# Patient Record
Sex: Female | Born: 1970 | Race: White | Hispanic: No | Marital: Single | Smoking: Never smoker
Health system: Southern US, Community
[De-identification: ages and names within clinical notes are randomized; demographics above are authoritative.]

## PROBLEM LIST (undated history)

## (undated) DIAGNOSIS — R293 Abnormal posture: Secondary | ICD-10-CM

## (undated) DIAGNOSIS — K219 Gastro-esophageal reflux disease without esophagitis: Secondary | ICD-10-CM

## (undated) DIAGNOSIS — E611 Iron deficiency: Secondary | ICD-10-CM

## (undated) DIAGNOSIS — F8189 Other developmental disorders of scholastic skills: Secondary | ICD-10-CM

## (undated) DIAGNOSIS — K2970 Gastritis, unspecified, without bleeding: Secondary | ICD-10-CM

## (undated) DIAGNOSIS — R32 Unspecified urinary incontinence: Secondary | ICD-10-CM

## (undated) DIAGNOSIS — D649 Anemia, unspecified: Secondary | ICD-10-CM

## (undated) DIAGNOSIS — F79 Unspecified intellectual disabilities: Secondary | ICD-10-CM

## (undated) HISTORY — PX: NO PAST SURGERIES: SHX2092

---

## 2013-10-28 ENCOUNTER — Ambulatory Visit
Admission: RE | Admit: 2013-10-28 | Discharge: 2013-10-28 | Disposition: A | Discharge status: Home / Self Care | Payer: Medicaid Other | Source: Ambulatory Visit | Attending: Family Medicine | Admitting: Family Medicine

## 2013-10-28 ENCOUNTER — Other Ambulatory Visit: Payer: Self-pay | Admitting: Family Medicine

## 2013-10-28 DIAGNOSIS — M955 Acquired deformity of pelvis: Secondary | ICD-10-CM

## 2014-05-05 ENCOUNTER — Encounter (HOSPITAL_COMMUNITY): Payer: Self-pay | Admitting: *Deleted

## 2014-05-05 ENCOUNTER — Other Ambulatory Visit: Payer: Self-pay | Admitting: Dentistry

## 2014-05-05 NOTE — Progress Notes (Signed)
Anesthesia Chart Review:  SAME DAY WORK-UP.  Patient is a [age] year female with mental retardation history who currently resides at a [facility] group home [address]. She is scheduled to dental surgery with Dr. Martie RoundMilner tomorrow. Reportedly she lived with her mother until she died within the past year.  Group home staff reported other history of GERD and iron deficiency anemia.  She also seems to have mouth pain.  She takes medications with pudding, but can take liquids okay.  Staff report that she will need to be sedated prior to labs/IV, etc as she can be combative. She has a brother in [place] and a sister in [place].  Dr. Katina DungHoover Royals completed her H&P. Reportedly, he may want additional labs drawn while she is under anesthesia.  Group home staff to bring in the appropriate paperwork to clarify.    Meds include Valium, Depakote (as a mood stabilizer), Ativan, Protonix, Seroquel.  Further evaluation on the day of surgery.  Hannah Ochsllison Aarionna Germer, PA-C [facility]Anesthesiology Phone [phone] 05/05/2014 12:52 PM

## 2014-05-05 NOTE — Progress Notes (Signed)
Pt is a resident of [facility] [address] [facility]. She is severely mentally retarded, is non-verbal and does not usually follow commands per Judeen Hammanshonda English, RN for group home. She takes her po meds with applesauce or pudding, so no meds are to be taken the morning of surgery. Bjorn LoserRhonda states pt will not allow anyone to do anything (labs, IV, etc) to her without being sedated. Pt can and will drink anything she is given, Bjorn LoserRhonda suggests that she be given a "cocktail" to sedate her prior to anything being done. States pt will fight. Per Bjorn Loserhonda, prior to pt coming to group home in July of 2015 pt had not had any medical or dental care. She had lived with her mother who died at the home and the patient had to be forcefully taken out of the home and put into group home care. She does have a brother and sister who are her legal guardians and they should be available by phone. Their info is in the pt's chart. A caregiver will be with pt and Bjorn LoserRhonda will be here at some point in time.

## 2014-05-06 ENCOUNTER — Ambulatory Visit (HOSPITAL_COMMUNITY): Payer: Medicaid Other | Admitting: Vascular Surgery

## 2014-05-06 ENCOUNTER — Encounter (HOSPITAL_COMMUNITY)
Admission: RE | Disposition: A | Discharge status: Home / Self Care | Payer: Self-pay | Source: Ambulatory Visit | Attending: Dentistry

## 2014-05-06 ENCOUNTER — Ambulatory Visit (HOSPITAL_COMMUNITY)
Admission: RE | Admit: 2014-05-06 | Discharge: 2014-05-06 | Disposition: A | Discharge status: Home / Self Care | Payer: Medicaid Other | Source: Ambulatory Visit | Attending: Dentistry | Admitting: Dentistry

## 2014-05-06 ENCOUNTER — Encounter (HOSPITAL_COMMUNITY): Payer: Self-pay | Admitting: *Deleted

## 2014-05-06 DIAGNOSIS — F73 Profound intellectual disabilities: Secondary | ICD-10-CM | POA: Diagnosis not present

## 2014-05-06 DIAGNOSIS — K219 Gastro-esophageal reflux disease without esophagitis: Secondary | ICD-10-CM | POA: Insufficient documentation

## 2014-05-06 DIAGNOSIS — K029 Dental caries, unspecified: Secondary | ICD-10-CM | POA: Diagnosis not present

## 2014-05-06 DIAGNOSIS — F79 Unspecified intellectual disabilities: Secondary | ICD-10-CM | POA: Insufficient documentation

## 2014-05-06 HISTORY — PX: DENTAL RESTORATION/EXTRACTION WITH X-RAY: SHX5796

## 2014-05-06 HISTORY — DX: Unspecified intellectual disabilities: F79

## 2014-05-06 HISTORY — DX: Gastritis, unspecified, without bleeding: K29.70

## 2014-05-06 HISTORY — DX: Iron deficiency: E61.1

## 2014-05-06 HISTORY — DX: Anemia, unspecified: D64.9

## 2014-05-06 HISTORY — DX: Gastro-esophageal reflux disease without esophagitis: K21.9

## 2014-05-06 LAB — COMPREHENSIVE METABOLIC PANEL
ALK PHOS: 53 U/L (ref 39–117)
ALT: 12 U/L (ref 0–35)
AST: 18 U/L (ref 0–37)
Albumin: 3.7 g/dL (ref 3.5–5.2)
Anion gap: 8 (ref 5–15)
BILIRUBIN TOTAL: 0.8 mg/dL (ref 0.3–1.2)
BUN: 12 mg/dL (ref 6–23)
CHLORIDE: 104 mmol/L (ref 96–112)
CO2: 24 mmol/L (ref 19–32)
CREATININE: 0.5 mg/dL (ref 0.50–1.10)
Calcium: 8.7 mg/dL (ref 8.4–10.5)
Glucose, Bld: 79 mg/dL (ref 70–99)
Potassium: 3.9 mmol/L (ref 3.5–5.1)
Sodium: 136 mmol/L (ref 135–145)
Total Protein: 6.4 g/dL (ref 6.0–8.3)

## 2014-05-06 LAB — CBC
HCT: 34 % — ABNORMAL LOW (ref 36.0–46.0)
Hemoglobin: 11.8 g/dL — ABNORMAL LOW (ref 12.0–15.0)
MCH: 31 pg (ref 26.0–34.0)
MCHC: 34.7 g/dL (ref 30.0–36.0)
MCV: 89.2 fL (ref 78.0–100.0)
PLATELETS: 167 10*3/uL (ref 150–400)
RBC: 3.81 MIL/uL — AB (ref 3.87–5.11)
RDW: 12.4 % (ref 11.5–15.5)
WBC: 4.4 10*3/uL (ref 4.0–10.5)

## 2014-05-06 SURGERY — DENTAL RESTORATION/EXTRACTION WITH X-RAY
Anesthesia: General | Site: Mouth

## 2014-05-06 MED ORDER — PHENYLEPHRINE HCL 10 MG/ML IJ SOLN
INTRAMUSCULAR | Status: AC
  Filled 2014-05-06: qty 1

## 2014-05-06 MED ORDER — KETOROLAC TROMETHAMINE 30 MG/ML IJ SOLN
INTRAMUSCULAR | Status: DC | PRN
  Administered 2014-05-06: 30 mg via INTRAVENOUS

## 2014-05-06 MED ORDER — CHLORHEXIDINE GLUCONATE 0.12 % MT SOLN
OROMUCOSAL | Status: DC | PRN
  Administered 2014-05-06: 15 mL via OROMUCOSAL

## 2014-05-06 MED ORDER — LIDOCAINE HCL (CARDIAC) 20 MG/ML IV SOLN
INTRAVENOUS | Status: DC | PRN
  Administered 2014-05-06: 50 mg via INTRAVENOUS

## 2014-05-06 MED ORDER — SODIUM CHLORIDE 0.9 % IJ SOLN
INTRAMUSCULAR | Status: AC
  Filled 2014-05-06: qty 10

## 2014-05-06 MED ORDER — ROCURONIUM BROMIDE 50 MG/5ML IV SOLN
INTRAVENOUS | Status: AC
  Filled 2014-05-06: qty 1

## 2014-05-06 MED ORDER — ONDANSETRON HCL 4 MG/2ML IJ SOLN
INTRAMUSCULAR | Status: AC
  Filled 2014-05-06: qty 2

## 2014-05-06 MED ORDER — LIDOCAINE-EPINEPHRINE 2 %-1:100000 IJ SOLN
INTRAMUSCULAR | Status: DC | PRN
  Administered 2014-05-06: 20 mL

## 2014-05-06 MED ORDER — GLYCOPYRROLATE 0.2 MG/ML IJ SOLN
INTRAMUSCULAR | Status: DC | PRN
  Administered 2014-05-06: 0.4 mg via INTRAVENOUS
  Administered 2014-05-06: 0.6 mg via INTRAVENOUS

## 2014-05-06 MED ORDER — NEOSTIGMINE METHYLSULFATE 10 MG/10ML IV SOLN
INTRAVENOUS | Status: DC | PRN
  Administered 2014-05-06: 3 mg via INTRAVENOUS

## 2014-05-06 MED ORDER — ONDANSETRON HCL 4 MG/2ML IJ SOLN: 4.0000 mg | Freq: Once | INTRAMUSCULAR | Status: DC | PRN

## 2014-05-06 MED ORDER — ARTIFICIAL TEARS OP OINT
TOPICAL_OINTMENT | OPHTHALMIC | Status: DC | PRN
Start: 2014-05-06 — End: 2014-05-06
  Administered 2014-05-06: 1 via OPHTHALMIC

## 2014-05-06 MED ORDER — NEOSTIGMINE METHYLSULFATE 10 MG/10ML IV SOLN
INTRAVENOUS | Status: AC
  Filled 2014-05-06: qty 1

## 2014-05-06 MED ORDER — MIDAZOLAM HCL 2 MG/2ML IJ SOLN
INTRAMUSCULAR | Status: AC
  Filled 2014-05-06: qty 2

## 2014-05-06 MED ORDER — OXYCODONE HCL 5 MG/5ML PO SOLN: 5.0000 mg | Freq: Once | ORAL | Status: DC | PRN

## 2014-05-06 MED ORDER — PROPOFOL 10 MG/ML IV BOLUS
INTRAVENOUS | Status: DC | PRN
  Administered 2014-05-06: 125 mg via INTRAVENOUS

## 2014-05-06 MED ORDER — FENTANYL CITRATE 0.05 MG/ML IJ SOLN
INTRAMUSCULAR | Status: DC | PRN
  Administered 2014-05-06: 75 ug via INTRAVENOUS

## 2014-05-06 MED ORDER — KETOROLAC TROMETHAMINE 30 MG/ML IJ SOLN
INTRAMUSCULAR | Status: AC
  Filled 2014-05-06: qty 1

## 2014-05-06 MED ORDER — GLYCOPYRROLATE 0.2 MG/ML IJ SOLN
INTRAMUSCULAR | Status: AC
  Filled 2014-05-06: qty 3

## 2014-05-06 MED ORDER — SODIUM CHLORIDE 0.9 % IV SOLN
10.0000 mg | INTRAVENOUS | Status: DC | PRN
  Administered 2014-05-06: 25 ug/min via INTRAVENOUS

## 2014-05-06 MED ORDER — FENTANYL CITRATE 0.05 MG/ML IJ SOLN: 25.0000 ug | INTRAMUSCULAR | Status: DC | PRN

## 2014-05-06 MED ORDER — FENTANYL CITRATE 0.05 MG/ML IJ SOLN
INTRAMUSCULAR | Status: AC
  Filled 2014-05-06: qty 5

## 2014-05-06 MED ORDER — LACTATED RINGERS IV SOLN
INTRAVENOUS | Status: DC | PRN
  Administered 2014-05-06 (×2): via INTRAVENOUS

## 2014-05-06 MED ORDER — PHENYLEPHRINE HCL 10 MG/ML IJ SOLN
INTRAMUSCULAR | Status: DC | PRN
  Administered 2014-05-06 (×3): 80 ug via INTRAVENOUS

## 2014-05-06 MED ORDER — LIDOCAINE HCL (CARDIAC) 20 MG/ML IV SOLN
INTRAVENOUS | Status: AC
  Filled 2014-05-06: qty 5

## 2014-05-06 MED ORDER — ROCURONIUM BROMIDE 100 MG/10ML IV SOLN
INTRAVENOUS | Status: DC | PRN
  Administered 2014-05-06: 30 mg via INTRAVENOUS

## 2014-05-06 MED ORDER — OXYCODONE HCL 5 MG PO TABS: 5.0000 mg | ORAL_TABLET | Freq: Once | ORAL | Status: DC | PRN

## 2014-05-06 MED ORDER — DEXAMETHASONE SODIUM PHOSPHATE 10 MG/ML IJ SOLN
INTRAMUSCULAR | Status: DC | PRN
  Administered 2014-05-06: 8 mg via INTRAVENOUS

## 2014-05-06 MED ORDER — EPHEDRINE SULFATE 50 MG/ML IJ SOLN
INTRAMUSCULAR | Status: AC
  Filled 2014-05-06: qty 1

## 2014-05-06 MED ORDER — DEXAMETHASONE SODIUM PHOSPHATE 4 MG/ML IJ SOLN
INTRAMUSCULAR | Status: AC
  Filled 2014-05-06: qty 2

## 2014-05-06 MED ORDER — EPHEDRINE SULFATE 50 MG/ML IJ SOLN
INTRAMUSCULAR | Status: DC | PRN
  Administered 2014-05-06 (×3): 10 mg via INTRAVENOUS

## 2014-05-06 MED ORDER — KETAMINE HCL 50 MG/ML IJ SOLN
INTRAMUSCULAR | Status: DC | PRN
  Administered 2014-05-06: 250 mg via INTRAMUSCULAR

## 2014-05-06 SURGICAL SUPPLY — 20 items
CANISTER SUCTION 2500CC (MISCELLANEOUS) ×3 IMPLANT
COVER PROBE W GEL 5X96 (DRAPES) ×3 IMPLANT
COVER SURGICAL LIGHT HANDLE (MISCELLANEOUS) ×3 IMPLANT
COVER TABLE BACK 60X90 (DRAPES) ×3 IMPLANT
DRAPE PROXIMA HALF (DRAPES) ×3 IMPLANT
GAUZE SPONGE 4X4 16PLY XRAY LF (GAUZE/BANDAGES/DRESSINGS) ×3 IMPLANT
GLOVE BIO SURGEON STRL SZ7.5 (GLOVE) ×3 IMPLANT
GOWN STRL REUS W/ TWL LRG LVL3 (GOWN DISPOSABLE) ×2 IMPLANT
GOWN STRL REUS W/TWL LRG LVL3 (GOWN DISPOSABLE) ×4
KIT BASIN OR (CUSTOM PROCEDURE TRAY) ×3 IMPLANT
KIT ROOM TURNOVER OR (KITS) ×3 IMPLANT
PAD ARMBOARD 7.5X6 YLW CONV (MISCELLANEOUS) ×6 IMPLANT
SPONGE SURGIFOAM ABS GEL SZ50 (HEMOSTASIS) ×3 IMPLANT
SUT CHROMIC 3 0 PS 2 (SUTURE) ×6 IMPLANT
SYR CONTROL 10ML LL (SYRINGE) ×3 IMPLANT
TOWEL OR 17X26 10 PK STRL BLUE (TOWEL DISPOSABLE) ×3 IMPLANT
TUBE CONNECTING 12'X1/4 (SUCTIONS) ×1
TUBE CONNECTING 12X1/4 (SUCTIONS) ×2 IMPLANT
WATER STERILE IRR 1000ML POUR (IV SOLUTION) ×6 IMPLANT
YANKAUER SUCT BULB TIP NO VENT (SUCTIONS) ×3 IMPLANT

## 2014-05-06 NOTE — Progress Notes (Signed)
Spoke with Dr. Noreene LarssonJoslin, informed him that there additional labs the resident home is requesting. He will see the pt. shortly.

## 2014-05-06 NOTE — Brief Op Note (Signed)
05/06/2014  12:32 PM  PATIENT:  Hannah Houston  44 y.o. female  PRE-OPERATIVE DIAGNOSIS:  DENTAL CARIES   POST-OPERATIVE DIAGNOSIS:  DENTAL CARIES   PROCEDURE:  Procedure(s) with comments: CLEANING XRAY, DENTAL RESTORATION AND EXTRACTIONS (N/A) - Recall Exam, Full mouth xrays, full mouth debridement,  Glass ionomers - #2 OF; #3 OL; #4 O; #6 F; #12 O; #13 O; #14 OFL; #20 DO Extractions - 12 Prime - #14, 15, 18, 19, 23, 27, 28, 29, 30, 31  SURGEON:  Surgeon(s) and Role:    * Esaw DaceWilliam E Gavon Majano Jr., DDS - Primary  PHYSICIAN ASSISTANT:   ASSISTANTS: Laqueta CarinaMary Elizabeth Andreyev   ANESTHESIA:   general  EBL:  Total I/O In: 1650 [I.V.:1650] Out: 100 [Blood:100]  BLOOD ADMINISTERED:none  DRAINS: none   LOCAL MEDICATIONS USED:  LIDOCAINE   SPECIMEN:  No Specimen  DISPOSITION OF SPECIMEN:  N/A  COUNTS:  YES  TOURNIQUET:  * No tourniquets in log *  DICTATION: .Note written in EPIC  PLAN OF CARE: Discharge to home after PACU  PATIENT DISPOSITION:  PACU - hemodynamically stable.   Delay start of Pharmacological VTE agent (>24hrs) due to surgical blood loss or risk of bleeding: not applicable

## 2014-05-06 NOTE — Op Note (Signed)
See Brief Op Notes for procedures.

## 2014-05-06 NOTE — Progress Notes (Signed)
[facility]. Orders for labs in resident's chart. Hannah LoserRhonda is asking that these labs be captured after pt. Is sedated.

## 2014-05-06 NOTE — Anesthesia Procedure Notes (Signed)
Procedure Name: Intubation Date/Time: 05/06/2014 9:47 AM Performed by: Carmela RimaMARTINELLI, Kymorah Korf F Pre-anesthesia Checklist: Patient being monitored, Suction available, Emergency Drugs available, Patient identified and Timeout performed Patient Re-evaluated:Patient Re-evaluated prior to inductionOxygen Delivery Method: Circle system utilized Preoxygenation: Pre-oxygenation with 100% oxygen Intubation Type: IV induction Ventilation: Mask ventilation without difficulty Laryngoscope Size: Mac and 3 Grade View: Grade I Nasal Tubes: Nasal prep performed, Right, Nasal Rae and Magill forceps- large, utilized Tube size: 6.0 mm Number of attempts: 2 (Per Dr. Noreene LarssonJoslin) Placement Confirmation: positive ETCO2,  ETT inserted through vocal cords under direct vision and breath sounds checked- equal and bilateral Tube secured with: Tape Dental Injury: Teeth and Oropharynx as per pre-operative assessment

## 2014-05-06 NOTE — H&P (Signed)
H&P reviewed, no changes in assessment  

## 2014-05-06 NOTE — Discharge Instructions (Signed)
What to eat: ° °For your first meals, you should eat lightly; only small meals initially.  If you do not have nausea, you may eat larger meals.  Avoid spicy, greasy and heavy food.   ° °General Anesthesia, Adult, Care After  °Refer to this sheet in the next few weeks. These instructions provide you with information on caring for yourself after your procedure. Your health care provider may also give you more specific instructions. Your treatment has been planned according to current medical practices, but problems sometimes occur. Call your health care provider if you have any problems or questions after your procedure.  °WHAT TO EXPECT AFTER THE PROCEDURE  °After the procedure, it is typical to experience:  °Sleepiness.  °Nausea and vomiting. °HOME CARE INSTRUCTIONS  °For the first 24 hours after general anesthesia:  °Have a responsible person with you.  °Do not drive a car. If you are alone, do not take public transportation.  °Do not drink alcohol.  °Do not take medicine that has not been prescribed by your health care provider.  °Do not sign important papers or make important decisions.  °You may resume a normal diet and activities as directed by your health care provider.  °Change bandages (dressings) as directed.  °If you have questions or problems that seem related to general anesthesia, call the hospital and ask for the anesthetist or anesthesiologist on call. °SEEK MEDICAL CARE IF:  °You have nausea and vomiting that continue the day after anesthesia.  °You develop a rash. °SEEK IMMEDIATE MEDICAL CARE IF:  °You have difficulty breathing.  °You have chest pain.  °You have any allergic problems. °Document Released: 06/10/2000 Document Revised: 11/04/2012 Document Reviewed: 09/17/2012  °ExitCare® Patient Information ©2014 [employer].  ° °Sore Throat  ° ° °A sore throat is a painful, burning, sore, or scratchy feeling of the throat. There may be pain or tenderness when swallowing or talking. You may have  other symptoms with a sore throat. These include coughing, sneezing, fever, or a swollen neck. A sore throat is often the first sign of another sickness. These sicknesses may include a cold, flu, strep throat, or an infection called mono. Most sore throats go away without medical treatment.  °HOME CARE  °Only take medicine as told by your doctor.  °Drink enough fluids to keep your pee (urine) clear or pale yellow.  °Rest as needed.  °Try using throat sprays, lozenges, or suck on hard candy (if older than 4 years or as told).  °Sip warm liquids, such as broth, herbal tea, or warm water with honey. Try sucking on frozen ice pops or drinking cold liquids.  °Rinse the mouth (gargle) with salt water. Mix 1 teaspoon salt with 8 ounces of water.  °Do not smoke. Avoid being around others when they are smoking.  °Put a humidifier in your bedroom at night to moisten the air. You can also turn on a hot shower and sit in the bathroom for 5-10 minutes. Be sure the bathroom door is closed. °GET HELP RIGHT AWAY IF:  °You have trouble breathing.  °You cannot swallow fluids, soft foods, or your spit (saliva).  °You have more puffiness (swelling) in the throat.  °Your sore throat does not get better in 7 days.  °You feel sick to your stomach (nauseous) and throw up (vomit).  °You have a fever or lasting symptoms for more than 2-3 days.  °You have a fever and your symptoms suddenly get worse. °MAKE SURE YOU:  °Understand these   instructions.  Will watch your condition.  Will get help right away if you are not doing well or get worse. Document Released: 12/12/2007 Document Revised: 11/27/2011 Document Reviewed: 11/10/2011  [facility] Patient Information 2015 [employer] [place]. This information is not intended to replace advice given to you by your health care provider. Make sure you discuss any questions you have with your health care provider.    Sinus precautions after surgery:  1. Avoid blowing your nose.  2. Avoid sneezing. If  you might sneeze, Keep her mouth open and do not pinch your nose closed.  3. Avoid sucking on straws. Avoid smoking.  4. Do not lift objects weighing more than 20 pounds  Call if questions arise.   MOUTH CARE AFTER SURGERY  FACTS:  Ice used in ice bag helps keep the swelling down, and can help lessen the pain.  It is easier to treat pain BEFORE it happens.  Spitting disturbs the clot and may cause bleeding to start again, or to get worse.  Smoking delays healing and can cause complications.  Sharing prescriptions can be dangerous. Do not take medications not recently prescribed for you.  Antibiotics may stop birth control pills from working. Use other means of birth control while on antibiotics.  Warm salt water rinses after the first 24 hours will help lessen the swelling: Use 1/2 teaspoonful of table salt per oz.of water. DO NOT:  Do not spit. Do not drink through a straw.  Strongly advised not to smoke, dip snuff or chew tobacco at least for 3 days.  Do not eat sharp or crunchy foods. Avoid the area of surgery when chewing.  Do not stop your antibiotics before your instructions say to do so.  Do not eat hot foods until bleeding has stopped. If you need to, let your food cool down to room temperature. EXPECT:  Some swelling, especially first 2-3 days.  Soreness or discomfort in varying degrees. Follow your dentist's instructions about how to handle pain before it starts.  Pinkish saliva or light blood in saliva, or on your pillow in the morning. This can last around 24 hours.  Bruising inside or outside the mouth. This may not show up until 2-3 days after surgery. Don't worry, it will go away in time.  Pieces of "bone" may work themselves loose. It's OK. If they bother you, let us know. WHAT TO DO IMMEDIATELY AFTER SURGERY:  Bite on the gauze with steady pressure for 1-2 hours. Don't chew on the gauze.  Do not lie down flat. Raise your head support especially for the first 24 hours.    Apply ice to your face on the side of the surgery. You may apply it 20 minutes on and a few minutes off. Ice for 8-12 hours. You may use ice up to 24 hours.  Before the numbness wears off, take a pain pill as instructed.  Prescription pain medication is not always required. SWELLING:  Expect swelling for the first couple of days. It should get better after that.  If swelling increases 3 days or so after surgery; let us know as soon as possible. FEVER:  Take Tylenol every 4 hours if needed to lower your temperature, especially if it is at 100F or higher.  Drink lots of fluids.  If the fever does not go away, let us know. BREATHING TROUBLE:  Any unusual difficulty breathing means you have to have someone bring you to the emergency room ASAP BLEEDING:  Light oozing is expected for  24 hours or so.  Prop head up with pillows  Avoid spitting  Do not confuse bright red fresh flowing blood with lots of saliva colored with a little bit of blood.  If you notice some bleeding, place gauze or a tea bag where it is bleeding and apply CONSTANT pressure by biting down for 1 hour. Avoid talking during this time. Do not remove the gauze or tea bag during this hour to "check" the bleeding.  If you notice bright RED bleeding FLOWING out of particular area, and filling the floor of your mouth, put a wad of gauze on that area, bite down firmly and constantly. Call us immediately. If we're closed, have someone bring you to the emergency room. ORAL HYGIENE:  Brush your teeth as usual after meals and before bedtime.  Use a soft toothbrush around the area of surgery.  DO NOT AVOID BRUSHING. Otherwise bacteria(germs) will grow and may delay healing or encourage infection.  Since you cannot spit, just gently rinse and let the water flow out of your mouth.  DO NOT SWISH HARD. EATING:  Cool liquids are a good point to start. Increase to soft foods as tolerated. PRESCRIPTIONS:  Follow the directions for your  prescriptions exactly as written.  If your doctor gave you a narcotic pain medication, do not drive, operate machinery or drink alcohol when on that medication. QUESTIONS:  Call our office during office hours

## 2014-05-06 NOTE — Progress Notes (Signed)
Spoke with Dr. Martie RoundMilner, he is OK with the way the consent is completed & signed by guardian/sibling

## 2014-05-06 NOTE — Transfer of Care (Signed)
Immediate Anesthesia Transfer of Care Note  Patient: Hannah Houston  Procedure(s) Performed: Procedure(s) with comments: CLEANING XRAY, DENTAL RESTORATION AND EXTRACTIONS (N/A) - Recall Exam, Full mouth xrays and full mouth debridement,  Glass ionomers - #2 OF; #3 OL; #4 O; #6 F; #12 O; #13 O; #14 OFL; #20 DO Extractions - 12 Prime - #14, 15, 18, 19, 23, 27, 28, 29, 30, 31  Patient Location: PACU  Anesthesia Type:General  Level of Consciousness: awake  Airway & Oxygen Therapy: Patient Spontanous Breathing and Patient connected to nasal cannula oxygen  Post-op Assessment: Report given to RN, Post -op Vital signs reviewed and stable and Patient moving all extremities X 4  Post vital signs: Reviewed and stable  Last Vitals: There were no vitals filed for this visit.  Complications: No apparent anesthesia complications

## 2014-05-06 NOTE — Brief Op Note (Signed)
05/06/2014  12:40 PM  PATIENT:  Hannah Houston  44 y.o. female  PRE-OPERATIVE DIAGNOSIS:  DENTAL CARIES   POST-OPERATIVE DIAGNOSIS:  DENTAL CARIES   PROCEDURE:  Procedure(s) with comments: CLEANING XRAY, DENTAL RESTORATION AND EXTRACTIONS (N/A) - Recall Exam, Full mouth xrays, full mouth debridement,  Glass ionomers - #2 OF; #3 OL; #4 O; #6 F; #12 O; #13 O; #14 OFL; #20 DO Extractions - 12 Prime - #14, 15, 18, 19, 23, 27, 28, 29, 30, 31  SURGEON:  Surgeon(s) and Role:    * Esaw DaceWilliam E Cera Rorke Jr., DDS - Primary  PHYSICIAN ASSISTANT:   ASSISTANTS:  Laqueta CarinaMary Elizabeth Andreyev  ANESTHESIA:   general  EBL:  Total I/O In: 1650 [I.V.:1650] Out: 100 [Blood:100]  BLOOD ADMINISTERED:none  DRAINS: none   LOCAL MEDICATIONS USED:  LIDOCAINE   SPECIMEN:  No Specimen  DISPOSITION OF SPECIMEN:  N/A  COUNTS:  YES  TOURNIQUET:  * No tourniquets in log *  DICTATION: .Note written in EPIC  PLAN OF CARE: Discharge to home after PACU  PATIENT DISPOSITION:  PACU - hemodynamically stable.   Delay start of Pharmacological VTE agent (>24hrs) due to surgical blood loss or risk of bleeding: not applicable

## 2014-05-06 NOTE — Anesthesia Postprocedure Evaluation (Signed)
  Anesthesia Post-op Note  Patient: Hannah Houston, Hannah Houston  Procedure(s) Performed: Procedure(s) with comments: CLEANING XRAY, DENTAL RESTORATION AND EXTRACTIONS (N/A) - Recall Exam, Full mouth xrays and full mouth debridement,  Glass ionomers - #2 OF; #3 OL; #4 O; #6 F; #12 O; #13 O; #14 OFL; #20 DO Extractions - 12 Prime - #14, 15, 18, 19, 23, 27, 28, 29, 30, 31  Patient Location: PACU  Anesthesia Type:General  Level of Consciousness: awake, alert  and pateint uncooperative  Airway and Oxygen Therapy: Patient Spontanous Breathing  Post-op Pain: mild  Post-op Assessment: Post-op Vital signs reviewed, Patient's Cardiovascular Status Stable, Respiratory Function Stable, Patent Airway and Pain level controlled  Post-op Vital Signs: stable  Last Vitals:  Filed Vitals:   05/06/14 1330  BP:   Pulse: 84  Temp:   Resp: 14    Complications: No apparent anesthesia complications

## 2014-05-06 NOTE — Anesthesia Preprocedure Evaluation (Addendum)
Anesthesia Evaluation  Patient identified by MRN, date of birth, ID band Patient unresponsive    Airway       Comment: Unable to assess Dental   Pulmonary          Cardiovascular     Neuro/Psych PSYCHIATRIC DISORDERS    GI/Hepatic GERD-  Medicated,  Endo/Other    Renal/GU      Musculoskeletal   Abdominal   Peds  Hematology  (+) anemia ,   Anesthesia Other Findings Profound mental retardation  Reproductive/Obstetrics                             Anesthesia Physical Anesthesia Plan  ASA: III  Anesthesia Plan: General ETT   Post-op Pain Management:    Induction:   Airway Management Planned:   Additional Equipment:   Intra-op Plan:   Post-operative Plan:   Informed Consent:   Dental Advisory Given  Plan Discussed with: Anesthesiologist, CRNA and Surgeon  Anesthesia Plan Comments:         Anesthesia Quick Evaluation

## 2014-05-07 ENCOUNTER — Encounter (HOSPITAL_COMMUNITY): Payer: Self-pay | Admitting: Dentistry

## 2014-05-07 NOTE — Op Note (Signed)
NAMEarney Mallet:  Cooler, Emmalee             ACCOUNT NO.:  [id number]  MEDICAL RECORD NO.:  [id number]  LOCATION:  [facility]                         FACILITY:  [facility]  PHYSICIAN:  Esaw DaceWilliam E. Royalty Fakhouri Jr., D.D.S.DATE OF BIRTH:  [birth year]  DATE OF PROCEDURE:  05/06/2014 DATE OF DISCHARGE:  05/06/2014                              OPERATIVE REPORT   PREOPERATIVE DIAGNOSIS:  Dental caries/behavior management issues due to intellectual/developmental disabilities.  Dental care provided in the OR for medically necessary treatment.  OPERATION:  Full-mouth oral rehabilitation including exam, x-rays, cleaning, extractions.  OPERATIVE SURGEON:  Azucena FreedWilliam E. Venancio Chenier, D.D.S.  ASSISTANT:  Laqueta CarinaMary Elizabeth Andreyev and hospital staff.  ANESTHESIA:  General.  PROCEDURE IN DETAIL:  The patient was brought into the operating room and placed in the supine position.  General anesthesia was administered via nasal intubation.  The patient was prepped and draped in the usual manner for an intraoral general dentistry procedure.  Oropharynx was suctioned and a moistened posterior throat pack was placed.  A full intraoral exam including all hard and soft tissues was performed.  This was on initial exam.  Soft tissue exam reveals floor of the mouth, buccal mucosa, soft palate, hard palate, tongue, gingival and frenum attachments all within normal limits with regard to size, color, and consistency.  Hard tissue exam reveals; Tooth #1 unerupted, #2 through 16 present, 17 missing, 18 through 23 present, 24, 25 and 26 missing, 27 through 32 present.  Full mouth series of digital x-rays was taken out.  Full mouth debridement was completed. Operative care was provided with a high-speed handpiece and #557 bur and a #4 bur and a slow speed handpiece #4 bur.  Glass ionomers were completed on #2 OF, 3 OL, 4 O, 6 F, 12 O, 13 O, 14 OLF, 20 DF.  Extractions were completed on #15, 16, 23, 27, 28, 29, 30, 31 and 32.  An alveloplasty was  completed on the lower right quadrant.  Gelfoam was inserted into the extraction sites and 3-0 chromic suture was used to close the size. 10 mL of 2% lidocaine 1:100,000 epinephrine was given and estimated blood loss of 30 mL. The mouth was suctioned dry and a posterior throat pack was carefully removed with constant suction.  The patient was awakened in the OR and transferred to the recovery room in good condition.  An extraction sheet was signed by the patient and representative.  POST PROCEDURE PRESCRIPTIONS:  Norco 7.5 mg/325 mg 12 tabs 1 q.4 hours p.r.n. pain and amoxicillin 500 mg 1 t.i.d. x7 days.     Esaw DaceWilliam E. Aubrey Blackard Jr., D.D.S.     WEM/MEDQ  D:  05/06/2014  T:  05/07/2014  Job:  [id number]

## 2014-08-23 ENCOUNTER — Other Ambulatory Visit (HOSPITAL_COMMUNITY): Payer: Self-pay | Admitting: Family Medicine

## 2014-08-23 DIAGNOSIS — R1314 Dysphagia, pharyngoesophageal phase: Secondary | ICD-10-CM

## 2014-08-30 ENCOUNTER — Ambulatory Visit (HOSPITAL_COMMUNITY): Admission: RE | Admit: 2014-08-30 | Payer: Medicaid Other | Source: Ambulatory Visit

## 2014-08-30 ENCOUNTER — Other Ambulatory Visit (HOSPITAL_COMMUNITY): Payer: Medicaid Other

## 2014-08-31 ENCOUNTER — Other Ambulatory Visit (HOSPITAL_COMMUNITY): Payer: Self-pay | Admitting: Family Medicine

## 2014-08-31 DIAGNOSIS — R1314 Dysphagia, pharyngoesophageal phase: Secondary | ICD-10-CM

## 2014-09-06 ENCOUNTER — Ambulatory Visit (HOSPITAL_COMMUNITY)
Admission: RE | Admit: 2014-09-06 | Discharge: 2014-09-06 | Disposition: A | Discharge status: Home / Self Care | Payer: Medicaid Other | Source: Ambulatory Visit | Attending: Family Medicine | Admitting: Family Medicine

## 2014-09-06 DIAGNOSIS — R634 Abnormal weight loss: Secondary | ICD-10-CM | POA: Insufficient documentation

## 2014-09-06 DIAGNOSIS — R1312 Dysphagia, oropharyngeal phase: Secondary | ICD-10-CM | POA: Diagnosis not present

## 2014-09-06 DIAGNOSIS — R1314 Dysphagia, pharyngoesophageal phase: Secondary | ICD-10-CM | POA: Diagnosis present

## 2014-09-06 DIAGNOSIS — R1310 Dysphagia, unspecified: Secondary | ICD-10-CM | POA: Insufficient documentation

## 2014-11-17 ENCOUNTER — Other Ambulatory Visit: Payer: Self-pay | Admitting: Family Medicine

## 2014-11-17 DIAGNOSIS — Z1231 Encounter for screening mammogram for malignant neoplasm of breast: Secondary | ICD-10-CM

## 2014-11-24 ENCOUNTER — Ambulatory Visit: Payer: Medicaid Other

## 2014-12-01 ENCOUNTER — Ambulatory Visit
Admission: RE | Admit: 2014-12-01 | Discharge: 2014-12-01 | Disposition: A | Discharge status: Home / Self Care | Payer: Medicaid Other | Source: Ambulatory Visit | Attending: Family Medicine | Admitting: Family Medicine

## 2014-12-01 DIAGNOSIS — Z1231 Encounter for screening mammogram for malignant neoplasm of breast: Secondary | ICD-10-CM

## 2015-02-14 IMAGING — CR DG THORACOLUMBAR SPINE 2V
3 series · 3 of 3 positions shown · non-contrast
Comparison: None.

CLINICAL DATA: Scoliosis study.

EXAM:
THORACOLUMBAR SCOLIOSIS STUDY - STANDING VIEWS

[w t-spine/l-spine junc. ap]
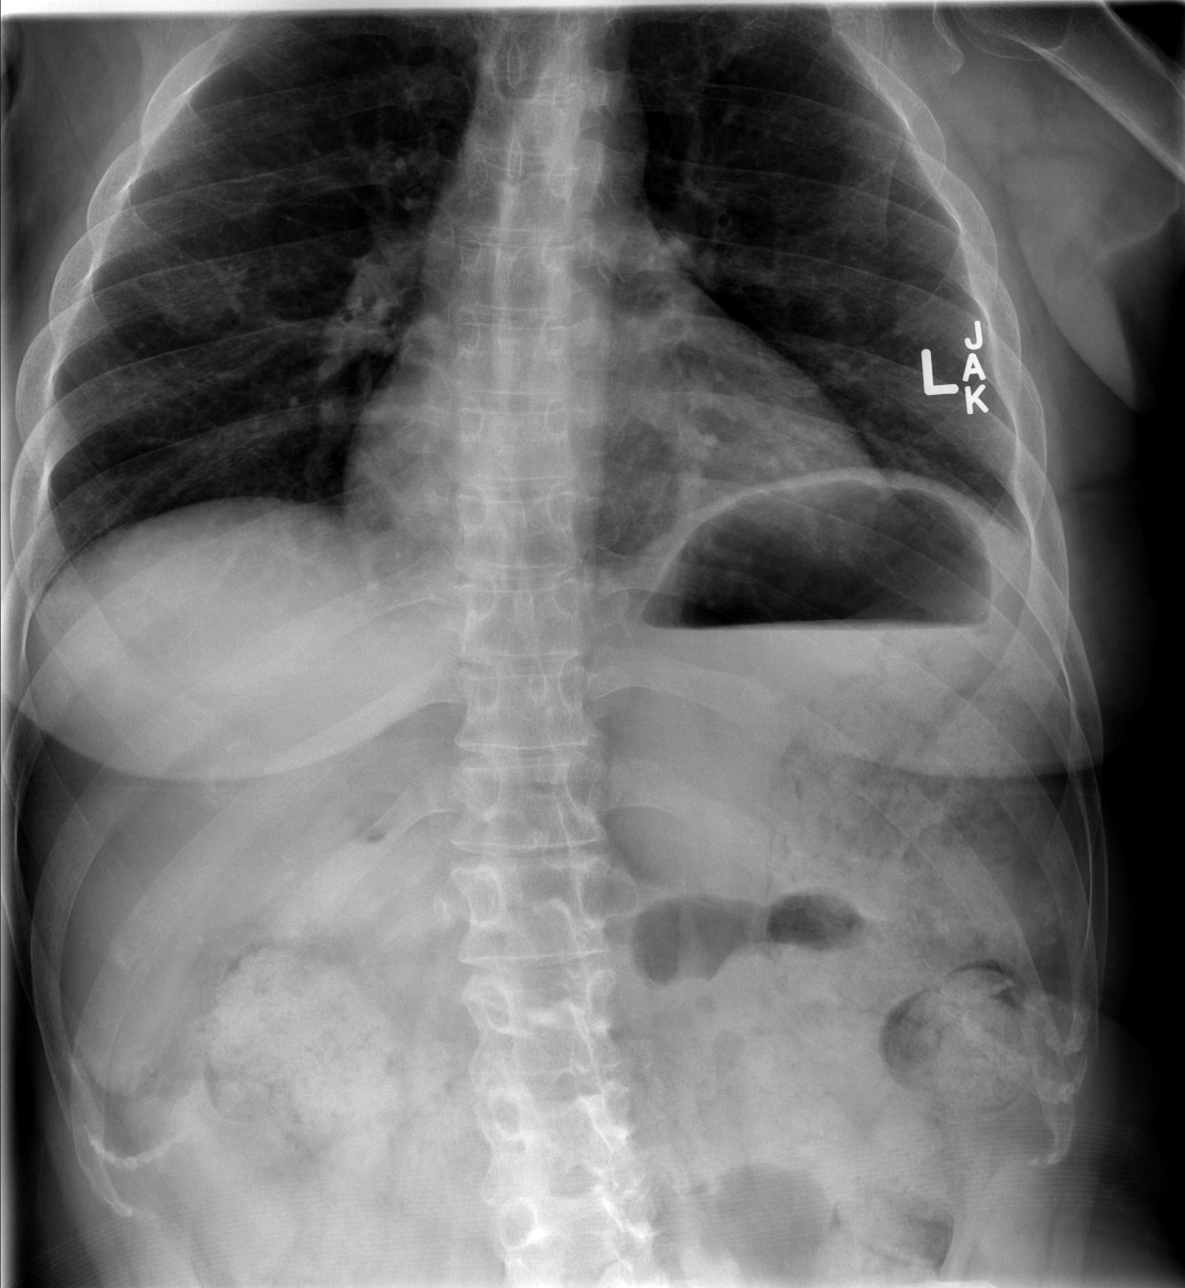

[w t-spine/l-spine junc lat (1 of 2)]
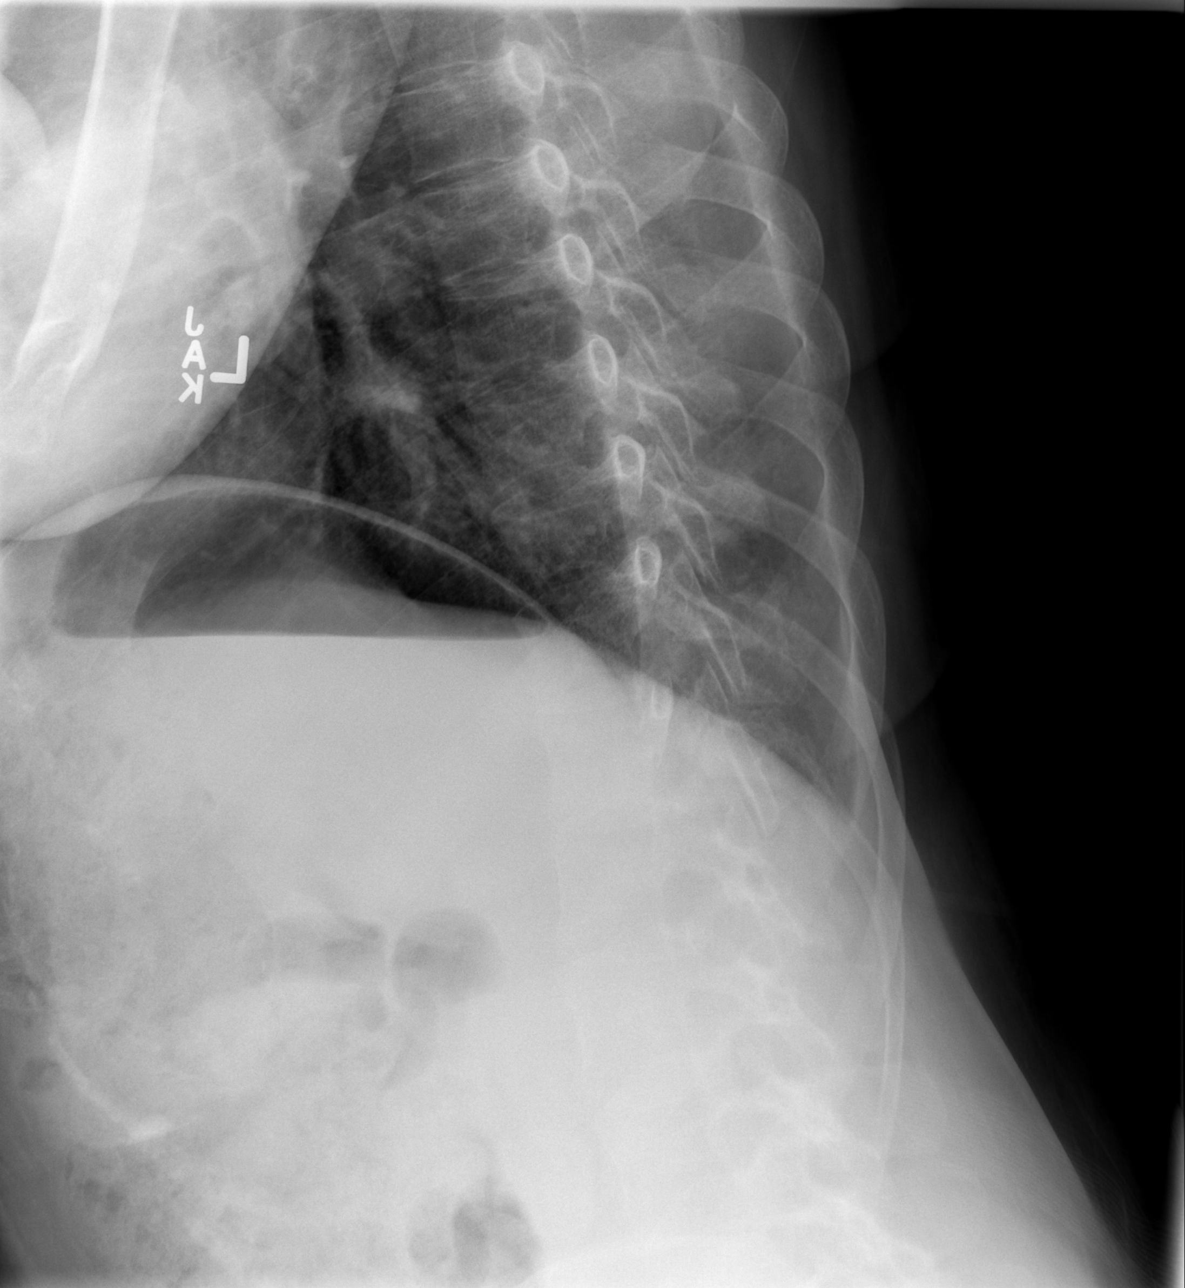

[w t-spine/l-spine junc lat (2 of 2)]
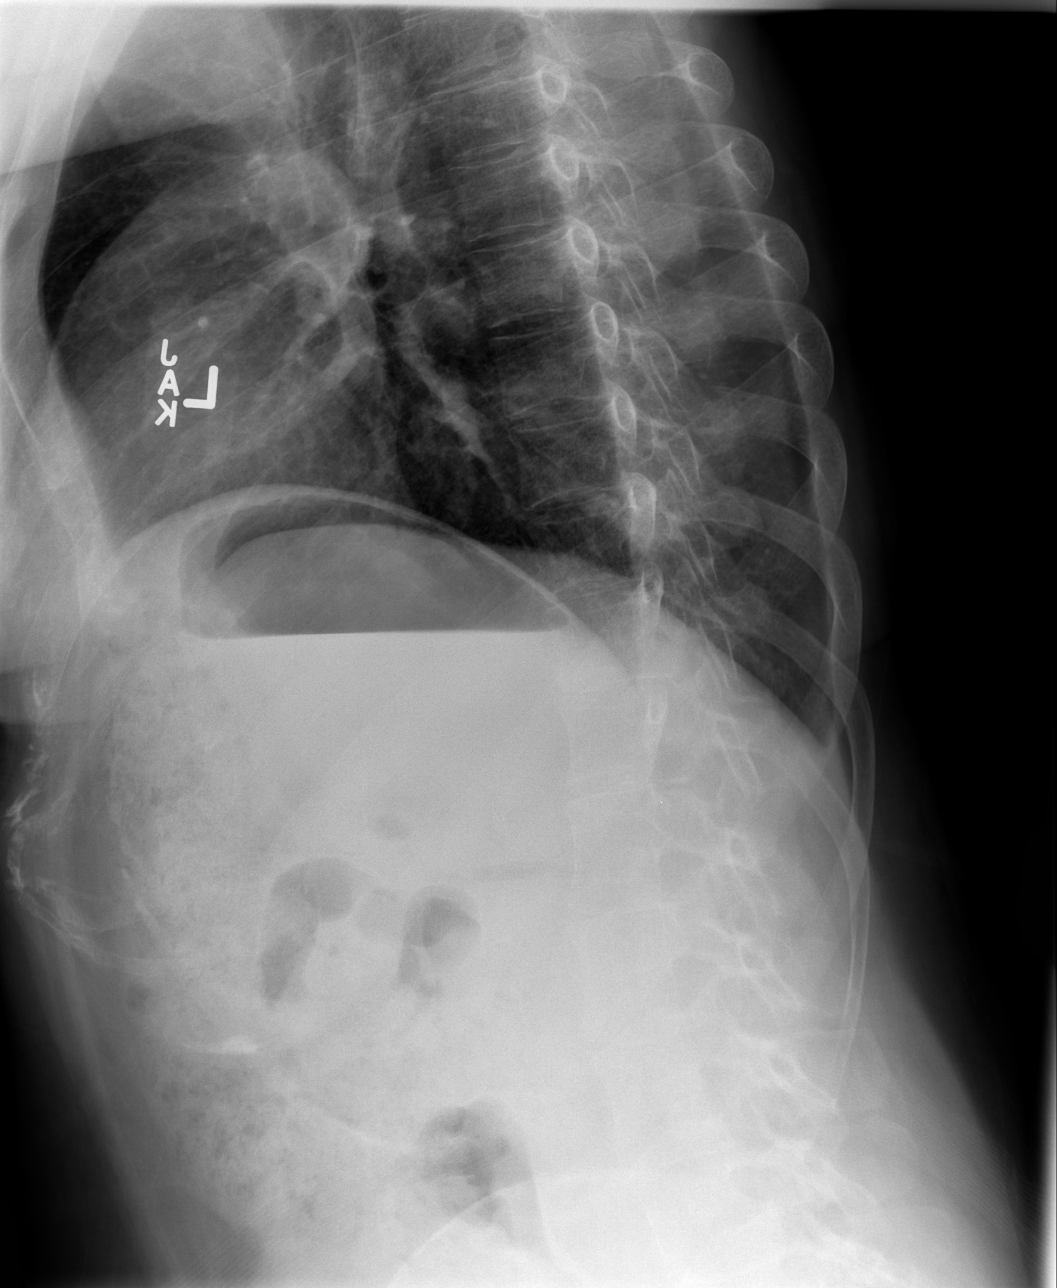

[3 of 3 positions shown; findings below may reference images not displayed]

FINDINGS: There is a minor gradual curvature of the thoracic spine, convex to
the right with the apex at T7 measuring approximately 7 degrees.
Thoracic spine is not comprehensively evaluated.

No bone lesion. No vertebral anomaly. There is straightening of the
normal thoracic kyphosis.

Soft tissues are unremarkable. Mild to moderate increased stool is
noted in the colon.
IMPRESSION: Minor curvature of the thoracic spine as described. No bone lesion
or vertebral anomaly. Exam limited by difficulty with patient
positioning.

## 2015-06-14 ENCOUNTER — Encounter (HOSPITAL_COMMUNITY): Payer: Self-pay | Admitting: *Deleted

## 2015-06-14 ENCOUNTER — Other Ambulatory Visit: Payer: Self-pay | Admitting: Dentistry

## 2015-06-14 NOTE — Anesthesia Preprocedure Evaluation (Addendum)
Anesthesia Evaluation  Patient identified by MRN, date of birth, ID band Patient awake    Reviewed: Allergy & Precautions, NPO status , Patient's Chart, lab work & pertinent test results  Airway      Mouth opening: Limited Mouth Opening  Dental  (+) Poor Dentition   Pulmonary    Pulmonary exam normal breath sounds clear to auscultation       Cardiovascular negative cardio ROS Normal cardiovascular exam Rhythm:Regular Rate:Normal     Neuro/Psych PSYCHIATRIC DISORDERS Profound Mental Retardation   GI/Hepatic Neg liver ROS, GERD  Medicated and Controlled,  Endo/Other  negative endocrine ROS  Renal/GU negative Renal ROS  negative genitourinary   Musculoskeletal negative musculoskeletal ROS (+)   Abdominal   Peds  Hematology  (+) anemia ,   Anesthesia Other Findings   Reproductive/Obstetrics                           Anesthesia Physical Anesthesia Plan  ASA: III  Anesthesia Plan: General   Post-op Pain Management:    Induction: Intravenous  Airway Management Planned: Nasal ETT  Additional Equipment:   Intra-op Plan:   Post-operative Plan: Extubation in OR  Informed Consent: I have reviewed the patients History and Physical, chart, labs and discussed the procedure including the risks, benefits and alternatives for the proposed anesthesia with the patient or authorized representative who has indicated his/her understanding and acceptance.   Dental advisory given  Plan Discussed with: CRNA, Anesthesiologist and Surgeon  Anesthesia Plan Comments: (Dr. Katina DungHoover Royals faxed over an order for labs to be drawn while patient is under anesthesia. Shonna ChockAllison Zelenak, PA-C)       Anesthesia Quick Evaluation                                   Anesthesia Evaluation  Patient identified by MRN, date of birth, ID band Patient unresponsive    Airway       Comment: Unable to assess  Dental   Pulmonary          Cardiovascular     Neuro/Psych PSYCHIATRIC DISORDERS    GI/Hepatic GERD-  Medicated,  Endo/Other    Renal/GU      Musculoskeletal   Abdominal   Peds  Hematology  (+) anemia ,   Anesthesia Other Findings Profound mental retardation  Reproductive/Obstetrics                             Anesthesia Physical Anesthesia Plan  ASA: III  Anesthesia Plan: General ETT   Post-op Pain Management:    Induction:   Airway Management Planned:   Additional Equipment:   Intra-op Plan:   Post-operative Plan:   Informed Consent:   Dental Advisory Given  Plan Discussed with: Anesthesiologist, CRNA and Surgeon  Anesthesia Plan Comments:         Anesthesia Quick Evaluation

## 2015-06-14 NOTE — Progress Notes (Signed)
Pt SDW pre-op call completed by pt nurse, Judeen Hammanshonda English, LPN. Nurse denies pt experiencing any SOB, chest pain, and being under the care of a cardiologist. Nurse denies pt had a stress test, echo and cardiac cath. Nurse denies pt had an EKG and chest x ray within the last year. Nurse made aware to stop administering Aspirin, vitamins, fish oil, Melatonin and herbal medications. Do not take any NSAIDs ie: Ibuprofen, Advil, Naproxen or any medication containing Aspirin. Nurse stated that pt will not be able to take morning medications without food; nurse made aware to hold morning medications DOS due to NPO status. Pt takes Depakote in the morning; can we consider IV? Nurse unable to fax [facility] and provide LMP  date because pt and chart is out of the building but nurses stated that pt still menstruates. Nurse to fax current H&P . Anesthesia to review pt history.

## 2015-06-14 NOTE — Progress Notes (Signed)
Anesthesia Chart Review: SAME DAY WORK-UP.  Patient is a [age] year female scheduled to dental restoration/extraction with xray and cleaning on 06/15/15 with Dr. Martie RoundMilner tomorrow.  History includes non-smoker, mental retardation, intellectual disability, non-verbal learning disorder, anemia, GERD, gastritis, dental surgery 05/06/14. HTN is not listed in her history of H&P but med list includes amlodipine. Historically has needed to be sedated prior to labs/IV, as she can become combative.   H&P completed by Hannah Capronhristopher Vis, PA on 06/12/15 [facility]).  Meds currently listed include albuterol, amlodipine, baclofen, Tegretol, Depakote (as a mood stabilizer), Cymbalta, folic acid, Ativan, Singulair, Seroquel, promethazine. She takes medications with pudding, so cannot take medications on the morning of her surgery.  Additional pre-operative testing and anesthesiologist evaluation on the day of surgery.   Dr. Katina DungHoover Royals has requested that CBC with differential, CMP, lipid panel, TSH, Free T4, Vit D 25-Oh, hgbA1c, serum iron, ferritin, and TIBC be drawn while under anesthesia. The lab order has been faxed. I asked PAT RN Darral Dashsther to have labs get appropriate tubes available.  Hannah Ochsllison Zelenak, PA-C [facility]An[facility]hesiology Phone [phone] 06/14/2015 1:35 PM

## 2015-06-15 ENCOUNTER — Encounter (HOSPITAL_COMMUNITY)
Admission: RE | Disposition: A | Discharge status: Skilled Nursing Facility | Payer: Self-pay | Source: Ambulatory Visit | Attending: Dentistry

## 2015-06-15 ENCOUNTER — Ambulatory Visit (HOSPITAL_COMMUNITY): Payer: Medicaid Other | Admitting: Vascular Surgery

## 2015-06-15 ENCOUNTER — Ambulatory Visit (HOSPITAL_COMMUNITY)
Admission: RE | Admit: 2015-06-15 | Discharge: 2015-06-15 | Disposition: A | Discharge status: Skilled Nursing Facility | Payer: Medicaid Other | Source: Ambulatory Visit | Attending: Dentistry | Admitting: Dentistry

## 2015-06-15 ENCOUNTER — Encounter (HOSPITAL_COMMUNITY): Payer: Self-pay | Admitting: Anesthesiology

## 2015-06-15 DIAGNOSIS — F73 Profound intellectual disabilities: Secondary | ICD-10-CM | POA: Diagnosis not present

## 2015-06-15 DIAGNOSIS — K029 Dental caries, unspecified: Secondary | ICD-10-CM | POA: Insufficient documentation

## 2015-06-15 HISTORY — DX: Other developmental disorders of scholastic skills: F81.89

## 2015-06-15 HISTORY — PX: DENTAL RESTORATION/EXTRACTION WITH X-RAY: SHX5796

## 2015-06-15 LAB — CBC WITH DIFFERENTIAL/PLATELET
BASOS ABS: 0 10*3/uL (ref 0.0–0.1)
BASOS PCT: 0 %
EOS PCT: 2 %
Eosinophils Absolute: 0.1 10*3/uL (ref 0.0–0.7)
HEMATOCRIT: 33.1 % — AB (ref 36.0–46.0)
Hemoglobin: 11.1 g/dL — ABNORMAL LOW (ref 12.0–15.0)
LYMPHS PCT: 54 %
Lymphs Abs: 2.3 10*3/uL (ref 0.7–4.0)
MCH: 30.2 pg (ref 26.0–34.0)
MCHC: 33.5 g/dL (ref 30.0–36.0)
MCV: 90.2 fL (ref 78.0–100.0)
Monocytes Absolute: 0.2 10*3/uL (ref 0.1–1.0)
Monocytes Relative: 6 %
NEUTROS ABS: 1.6 10*3/uL — AB (ref 1.7–7.7)
Neutrophils Relative %: 38 %
PLATELETS: 154 10*3/uL (ref 150–400)
RBC: 3.67 MIL/uL — AB (ref 3.87–5.11)
RDW: 12.6 % (ref 11.5–15.5)
WBC: 4.2 10*3/uL (ref 4.0–10.5)

## 2015-06-15 LAB — COMPREHENSIVE METABOLIC PANEL
ALBUMIN: 3.5 g/dL (ref 3.5–5.0)
ALT: 13 U/L — AB (ref 14–54)
ANION GAP: 6 (ref 5–15)
AST: 21 U/L (ref 15–41)
Alkaline Phosphatase: 43 U/L (ref 38–126)
BUN: 12 mg/dL (ref 6–20)
CO2: 27 mmol/L (ref 22–32)
Calcium: 8.6 mg/dL — ABNORMAL LOW (ref 8.9–10.3)
Chloride: 104 mmol/L (ref 101–111)
Creatinine, Ser: 0.53 mg/dL (ref 0.44–1.00)
GFR calc Af Amer: >60 mL/min (ref >60)
Glucose, Bld: 85 mg/dL (ref 65–99)
POTASSIUM: 4 mmol/L (ref 3.5–5.1)
Sodium: 137 mmol/L (ref 135–145)
TOTAL PROTEIN: 6.5 g/dL (ref 6.5–8.1)
Total Bilirubin: 1 mg/dL (ref 0.3–1.2)

## 2015-06-15 LAB — HCG, SERUM, QUALITATIVE: PREG SERUM: NEGATIVE

## 2015-06-15 LAB — LIPID PANEL
CHOL/HDL RATIO: 3.2 ratio
CHOLESTEROL: 167 mg/dL (ref 0–200)
HDL: 53 mg/dL (ref >40)
LDL Cholesterol: 102 mg/dL — ABNORMAL HIGH (ref 0–99)
TRIGLYCERIDES: 59 mg/dL (ref <150)
VLDL: 12 mg/dL (ref 0–40)

## 2015-06-15 LAB — IRON AND TIBC
Iron: 144 ug/dL (ref 28–170)
Saturation Ratios: 42 % — ABNORMAL HIGH (ref 10.4–31.8)
TIBC: 346 ug/dL (ref 250–450)
UIBC: 202 ug/dL

## 2015-06-15 LAB — TSH: TSH: 2.958 u[IU]/mL (ref 0.350–4.500)

## 2015-06-15 SURGERY — DENTAL RESTORATION/EXTRACTION WITH X-RAY
Anesthesia: General | Site: Mouth

## 2015-06-15 MED ORDER — ONDANSETRON HCL 4 MG/2ML IJ SOLN
INTRAMUSCULAR | Status: DC | PRN
  Administered 2015-06-15: 4 mg via INTRAVENOUS

## 2015-06-15 MED ORDER — CHLORHEXIDINE GLUCONATE 0.12 % MT SOLN
OROMUCOSAL | Status: DC | PRN
  Administered 2015-06-15: 15 mL via OROMUCOSAL

## 2015-06-15 MED ORDER — SODIUM CHLORIDE 0.9 % IJ SOLN
INTRAMUSCULAR | Status: AC
  Filled 2015-06-15: qty 20

## 2015-06-15 MED ORDER — SUCCINYLCHOLINE CHLORIDE 20 MG/ML IJ SOLN
INTRAMUSCULAR | Status: DC | PRN
  Administered 2015-06-15: 80 mg via INTRAVENOUS

## 2015-06-15 MED ORDER — GLYCOPYRROLATE 0.2 MG/ML IJ SOLN
INTRAMUSCULAR | Status: DC | PRN
  Administered 2015-06-15: .2 mg via INTRAVENOUS

## 2015-06-15 MED ORDER — LACTATED RINGERS IV SOLN
INTRAVENOUS | Status: DC | PRN
  Administered 2015-06-15 (×2): via INTRAVENOUS

## 2015-06-15 MED ORDER — SUCCINYLCHOLINE CHLORIDE 20 MG/ML IJ SOLN
INTRAMUSCULAR | Status: AC
  Filled 2015-06-15: qty 1

## 2015-06-15 MED ORDER — PROPOFOL 10 MG/ML IV BOLUS
INTRAVENOUS | Status: DC | PRN
  Administered 2015-06-15: 50 mg via INTRAVENOUS

## 2015-06-15 MED ORDER — KETOROLAC TROMETHAMINE 30 MG/ML IJ SOLN
INTRAMUSCULAR | Status: AC
  Filled 2015-06-15: qty 1

## 2015-06-15 MED ORDER — FENTANYL CITRATE (PF) 250 MCG/5ML IJ SOLN
INTRAMUSCULAR | Status: AC
  Filled 2015-06-15: qty 5

## 2015-06-15 MED ORDER — SODIUM CHLORIDE 0.9 % IJ SOLN
INTRAMUSCULAR | Status: AC
  Filled 2015-06-15: qty 10

## 2015-06-15 MED ORDER — STERILE WATER FOR IRRIGATION IR SOLN
Status: DC | PRN
  Administered 2015-06-15: 1000 mL

## 2015-06-15 MED ORDER — PHENYLEPHRINE HCL 10 MG/ML IJ SOLN
INTRAMUSCULAR | Status: DC | PRN
  Administered 2015-06-15: 80 ug via INTRAVENOUS
  Administered 2015-06-15: 40 ug via INTRAVENOUS
  Administered 2015-06-15 (×2): 80 ug via INTRAVENOUS

## 2015-06-15 MED ORDER — EPHEDRINE SULFATE 50 MG/ML IJ SOLN
INTRAMUSCULAR | Status: AC
  Filled 2015-06-15: qty 2

## 2015-06-15 MED ORDER — ARTIFICIAL TEARS OP OINT
TOPICAL_OINTMENT | OPHTHALMIC | Status: AC
  Filled 2015-06-15: qty 3.5

## 2015-06-15 MED ORDER — EPHEDRINE SULFATE 50 MG/ML IJ SOLN
INTRAMUSCULAR | Status: DC | PRN
Start: 2015-06-15 — End: 2015-06-15
  Administered 2015-06-15: 10 mg via INTRAVENOUS

## 2015-06-15 MED ORDER — EPHEDRINE SULFATE 50 MG/ML IJ SOLN
INTRAMUSCULAR | Status: AC
  Filled 2015-06-15: qty 1

## 2015-06-15 MED ORDER — MIDAZOLAM HCL 2 MG/2ML IJ SOLN
INTRAMUSCULAR | Status: AC
  Filled 2015-06-15: qty 2

## 2015-06-15 MED ORDER — KETAMINE HCL 100 MG/ML IJ SOLN
INTRAMUSCULAR | Status: DC | PRN
  Administered 2015-06-15: 150 mg via INTRAVENOUS

## 2015-06-15 MED ORDER — CEFAZOLIN SODIUM 1 G IJ SOLR
INTRAMUSCULAR | Status: AC
  Filled 2015-06-15: qty 20

## 2015-06-15 MED ORDER — KETAMINE HCL 100 MG/ML IJ SOLN
INTRAMUSCULAR | Status: AC
  Filled 2015-06-15: qty 1

## 2015-06-15 MED ORDER — FENTANYL CITRATE (PF) 100 MCG/2ML IJ SOLN
INTRAMUSCULAR | Status: DC | PRN
  Administered 2015-06-15: 50 ug via INTRAVENOUS

## 2015-06-15 MED ORDER — LIDOCAINE-EPINEPHRINE 2 %-1:100000 IJ SOLN
INTRAMUSCULAR | Status: AC
  Filled 2015-06-15: qty 1

## 2015-06-15 MED ORDER — DEXAMETHASONE SODIUM PHOSPHATE 10 MG/ML IJ SOLN
INTRAMUSCULAR | Status: AC
  Filled 2015-06-15: qty 1

## 2015-06-15 MED ORDER — OXYMETAZOLINE HCL 0.05 % NA SOLN
NASAL | Status: AC
  Filled 2015-06-15: qty 15

## 2015-06-15 MED ORDER — LIDOCAINE HCL (CARDIAC) 20 MG/ML IV SOLN
INTRAVENOUS | Status: AC
  Filled 2015-06-15: qty 10

## 2015-06-15 MED ORDER — PROPOFOL 10 MG/ML IV BOLUS
INTRAVENOUS | Status: AC
  Filled 2015-06-15: qty 20

## 2015-06-15 MED ORDER — LIDOCAINE-EPINEPHRINE 2 %-1:100000 IJ SOLN
INTRAMUSCULAR | Status: DC | PRN
  Administered 2015-06-15: 20 mL via INTRADERMAL

## 2015-06-15 MED ORDER — ONDANSETRON HCL 4 MG/2ML IJ SOLN
INTRAMUSCULAR | Status: AC
  Filled 2015-06-15: qty 2

## 2015-06-15 MED ORDER — STERILE WATER FOR INJECTION IJ SOLN
INTRAMUSCULAR | Status: AC
  Filled 2015-06-15: qty 10

## 2015-06-15 MED ORDER — ROCURONIUM BROMIDE 50 MG/5ML IV SOLN
INTRAVENOUS | Status: AC
  Filled 2015-06-15: qty 1

## 2015-06-15 MED ORDER — ONDANSETRON HCL 4 MG/2ML IJ SOLN
INTRAMUSCULAR | Status: AC
  Filled 2015-06-15: qty 4

## 2015-06-15 SURGICAL SUPPLY — 28 items
CANISTER SUCTION 2500CC (MISCELLANEOUS) ×3 IMPLANT
COVER PROBE W GEL 5X96 (DRAPES) ×3 IMPLANT
COVER SURGICAL LIGHT HANDLE (MISCELLANEOUS) ×3 IMPLANT
COVER TABLE BACK 60X90 (DRAPES) ×3 IMPLANT
DECANTER SPIKE VIAL GLASS SM (MISCELLANEOUS) ×3 IMPLANT
DRAPE PROXIMA HALF (DRAPES) ×3 IMPLANT
ELECT REM PT RETURN 9FT ADLT (ELECTROSURGICAL) ×3
ELECTRODE REM PT RTRN 9FT ADLT (ELECTROSURGICAL) ×1 IMPLANT
GAUZE SPONGE 4X4 16PLY XRAY LF (GAUZE/BANDAGES/DRESSINGS) ×3 IMPLANT
GLOVE BIO SURGEON STRL SZ7.5 (GLOVE) ×3 IMPLANT
GOWN STRL REUS W/ TWL LRG LVL3 (GOWN DISPOSABLE) ×2 IMPLANT
GOWN STRL REUS W/TWL LRG LVL3 (GOWN DISPOSABLE) ×4
KIT BASIN OR (CUSTOM PROCEDURE TRAY) ×3 IMPLANT
KIT ROOM TURNOVER OR (KITS) ×3 IMPLANT
NEEDLE 25GX 5/8IN NON SAFETY (NEEDLE) ×3 IMPLANT
NEEDLE 27GAX1X1/2 (NEEDLE) IMPLANT
PAD ARMBOARD 7.5X6 YLW CONV (MISCELLANEOUS) ×3 IMPLANT
PENCIL BUTTON HOLSTER BLD 10FT (ELECTRODE) ×3 IMPLANT
SPONGE SURGIFOAM ABS GEL SZ50 (HEMOSTASIS) IMPLANT
SUT CHROMIC 3 0 PS 2 (SUTURE) ×3 IMPLANT
SYR CONTROL 10ML LL (SYRINGE) ×3 IMPLANT
TOOTHBRUSH ADULT (PERSONAL CARE ITEMS) IMPLANT
TOWEL OR 17X26 10 PK STRL BLUE (TOWEL DISPOSABLE) IMPLANT
TUBE CONNECTING 12'X1/4 (SUCTIONS) ×1
TUBE CONNECTING 12X1/4 (SUCTIONS) ×2 IMPLANT
WATER STERILE IRR 1000ML POUR (IV SOLUTION) ×3 IMPLANT
WATER TABLETS ICX (MISCELLANEOUS) IMPLANT
YANKAUER SUCT BULB TIP NO VENT (SUCTIONS) ×3 IMPLANT

## 2015-06-15 NOTE — Brief Op Note (Signed)
06/15/2015  11:58 AM  PATIENT:  Hannah Houston  45 y.o. female  PRE-OPERATIVE DIAGNOSIS:  dental carries  POST-OPERATIVE DIAGNOSIS:  dental carries  PROCEDURE:  Procedure(s): DENTAL RESTORATION; EXTRACTION OF TEETH NUMBER TWO AND EIGHTEEN WITH X-RAY and cleaning (N/A), Glass Ionomer #3,4,5,6, FMX, FMD, Periodic Exam  SURGEON:  Surgeon(s) and Role:    * Boneta LucksWilliam Kell Ferris, DDS - Primary  PHYSICIAN ASSISTANT:   ASSISTANTS:  Laqueta CarinaMary Elizabeth Andreyev  ANESTHESIA:   general  EBL:  Total I/O In: 1200 [I.V.:1200] Out: 20 [Blood:20]  BLOOD ADMINISTERED:none  DRAINS: none   LOCAL MEDICATIONS USED:  LIDOCAINE   SPECIMEN:  No Specimen  DISPOSITION OF SPECIMEN:  N/A  COUNTS:  YES  TOURNIQUET:  * No tourniquets in log *  DICTATION: .Note written in EPIC  PLAN OF CARE: Discharge to home after PACU  PATIENT DISPOSITION:  PACU - hemodynamically stable.   Delay start of Pharmacological VTE agent (>24hrs) due to surgical blood loss or risk of bleeding: not applicable

## 2015-06-15 NOTE — Op Note (Signed)
Recall Exam, FMX, FMD, Extractions #2, 18, Glass Ionomers #3,4,5,6 With 4 cc 2% Lidocaine with 1:100,000 epi EBL 20 cc

## 2015-06-15 NOTE — Anesthesia Postprocedure Evaluation (Signed)
Anesthesia Post Note  Patient: Surveyor, miningCrystal Deloria  Procedure(s) Performed: Procedure(s) (LRB): DENTAL RESTORATION; EXTRACTION OF TEETH NUMBER TWO AND EIGHTEEN WITH X-RAY and cleaning (N/A)  Patient location during evaluation: PACU Anesthesia Type: General Level of consciousness: awake and alert Pain management: pain level controlled Vital Signs Assessment: post-procedure vital signs reviewed and stable Respiratory status: spontaneous breathing, nonlabored ventilation and respiratory function stable Cardiovascular status: blood pressure returned to baseline and stable Postop Assessment: no signs of nausea or vomiting Anesthetic complications: no    Last Vitals:  Filed Vitals:   06/15/15 0826 06/15/15 1219  Temp:  36.4 C  Resp: 18     Last Pain: There were no vitals filed for this visit.               Kennedy Brines A.

## 2015-06-15 NOTE — H&P (Signed)
H&P reviewed, no changes in assessment  

## 2015-06-15 NOTE — Progress Notes (Signed)
All information obtained through [facility] Assistant at [facility] and via phone with Judeen Hammanshonda English, [facility] [phone].

## 2015-06-15 NOTE — Transfer of Care (Signed)
Immediate Anesthesia Transfer of Care Note  Patient: Hannah Houston  Procedure(s) Performed: Procedure(s): DENTAL RESTORATION; EXTRACTION OF TEETH NUMBER TWO AND EIGHTEEN WITH X-RAY and cleaning (N/A)  Patient Location: PACU  Anesthesia Type:General  Level of Consciousness: awake, alert  and oriented  Airway & Oxygen Therapy: Patient Spontanous Breathing and Patient connected to nasal cannula oxygen  Post-op Assessment: Report given to RN, Post -op Vital signs reviewed and stable and Patient moving all extremities X 4  Post vital signs: Reviewed and stable  Last Vitals:  Filed Vitals:   06/15/15 0826  Resp: 18    Complications: No apparent anesthesia complications

## 2015-06-15 NOTE — Anesthesia Procedure Notes (Signed)
Procedure Name: Intubation Date/Time: 06/15/2015 10:07 AM Performed by: Carmela RimaMARTINELLI, Thaer Miyoshi F Pre-anesthesia Checklist: Patient being monitored, Suction available, Emergency Drugs available, Patient identified and Timeout performed Patient Re-evaluated:Patient Re-evaluated prior to inductionOxygen Delivery Method: Circle system utilized Preoxygenation: Pre-oxygenation with 100% oxygen Intubation Type: Rapid sequence and Inhalational induction with existing ETT Ventilation: Mask ventilation without difficulty Laryngoscope Size: Mac and 3 Nasal Tubes: Left, Magill forceps- large, utilized and Nasal Rae Tube size: 6.0 mm Number of attempts: 1 Placement Confirmation: positive ETCO2,  ETT inserted through vocal cords under direct vision and breath sounds checked- equal and bilateral Secured at: 23 cm Tube secured with: Tape Dental Injury: Teeth and Oropharynx as per pre-operative assessment

## 2015-06-16 ENCOUNTER — Encounter (HOSPITAL_COMMUNITY): Payer: Self-pay | Admitting: Dentistry

## 2015-06-16 LAB — HEMOGLOBIN A1C
HEMOGLOBIN A1C: 5.2 % (ref 4.8–5.6)
Mean Plasma Glucose: 103 mg/dL

## 2015-06-16 LAB — VITAMIN D 25 HYDROXY (VIT D DEFICIENCY, FRACTURES): Vit D, 25-Hydroxy: 25.2 ng/mL — ABNORMAL LOW (ref 30.0–100.0)

## 2015-06-16 LAB — T4: T4 TOTAL: 4.8 ug/dL (ref 4.5–12.0)

## 2015-06-16 NOTE — Op Note (Signed)
NAMEarney Mallet:  Hannah Houston, Hannah Houston             ACCOUNT NO.:  [id number]  MEDICAL RECORD NO.:  [id number]  LOCATION:  [facility]                         FACILITY:  [facility]  PHYSICIAN:  Esaw DaceWilliam E. Sarayah Bacchi Jr., D.D.S.DATE OF BIRTH:  10-06-1970  DATE OF PROCEDURE:  06/15/2015 DATE OF DISCHARGE:  06/15/2015                              OPERATIVE REPORT   PREOPERATIVE DIAGNOSIS:  Dental caries/behavior management issues due to intellectual/developmental disabilities.  Dental care provided in the OR for medically necessary treatment.  OPERATION:  Full mouth oral rehabilitation including exam, x-rays, cleaning, extractions.  SURGEON:  Azucena FreedWilliam E. Gearldean Lomanto, D.D.S.  ASSISTANT:  Laqueta CarinaMary Elizabeth Andreyev and hospital staff.  ANESTHESIA:  General.  PROCEDURE IN DETAIL:  The patient was brought into the operating room and placed in the supine position.  General anesthesia was administered via nasal intubation.  The patient was prepped and draped in the usual manner for an intraoral general dentistry procedure.  Oropharynx was suctioned.  A moistened posterior throat pack was placed.  A full intraoral exam including all hard and soft tissues was performed.  This was a recall exam.  Soft tissue exam reveals the floor of the mouth, buccal mucosa, soft palate, hard palate, tongue, gingival, and frenum attachments all within normal limits with regard to size, color, and consistency.  Hard tissue exam reveals #1 missing; 2 through 14 present; 15, 16, 17 missing; 18 present; 19 missing; 20, 21, 22 present; and 23 through 32 missing.  Full-mouth series of digital x-rays were taken and full mouth debridement was completed.  Operative care was provided with a high-speed handpiece #557 and #4 bur and a slow-speed handpiece #4 bur.  Glass ionomers were completed on #3, 4, 5, 6; all occlusals. Extractions were completed on #2 and #18.  4 mL of 2% lidocaine with 1:100,000 epinephrine was given.  Estimated blood loss was 20 mL.   The mouth was suctioned dry and a posterior throat pack was carefully removed with constant suction.  The patient was awakened in the OR, and transferred to the recovery room in good condition.  An extraction sheet was signed by the patient's representative.     Esaw DaceWilliam E. Hymie Gorr Jr., D.D.S.     WEM/MEDQ  D:  06/15/2015  T:  06/16/2015  Job:  [id number]

## 2016-05-01 ENCOUNTER — Encounter: Payer: Self-pay | Admitting: General Practice

## 2016-05-29 ENCOUNTER — Encounter: Payer: Medicaid Other | Admitting: Obstetrics & Gynecology

## 2016-06-13 ENCOUNTER — Ambulatory Visit (INDEPENDENT_AMBULATORY_CARE_PROVIDER_SITE_OTHER): Payer: Medicaid Other | Admitting: Obstetrics & Gynecology

## 2016-06-13 ENCOUNTER — Encounter (HOSPITAL_COMMUNITY): Payer: Self-pay

## 2016-06-13 DIAGNOSIS — N939 Abnormal uterine and vaginal bleeding, unspecified: Secondary | ICD-10-CM

## 2016-06-13 DIAGNOSIS — N946 Dysmenorrhea, unspecified: Secondary | ICD-10-CM

## 2016-06-13 DIAGNOSIS — Z3042 Encounter for surveillance of injectable contraceptive: Secondary | ICD-10-CM | POA: Diagnosis not present

## 2016-06-13 MED ORDER — MEDROXYPROGESTERONE ACETATE 150 MG/ML IM SUSP
150.0000 mg | Freq: Once | INTRAMUSCULAR | Status: AC
  Administered 2016-06-13: 150 mg via INTRAMUSCULAR

## 2016-06-13 NOTE — Progress Notes (Signed)
History:  46 y.o. No obstetric history on file. Pt has begun having very heavy cycles in the past year and has had very painful cycles.  She cannot tolerate exams. She has prev had an EUA for dental care. When she first moved to her current facility in 09/2013 she was not having cycles.  They were not sure if she was menopausal. She started having cycles ~1 year prev. She is given Tylenol for the pain. When she is on her cycle she groans and cries out and hits her lower abd.  The caretaker says that it is difficult to keep her hands away from her pelvic area during that time.       Pt was prev under her mothers care and her mother died and she  Was originally placed in a nursing home and has now been transferred to her current facility which takes care of adults with special needs.  The following portions of the patient's history were reviewed and updated as appropriate: allergies, current medications, past family history, past medical history, past social history, past surgical history and problem list.  Review of Systems:  Pertinent items are noted in HPI.   Objective:  Physical Exam Last menstrual period 04/21/2016. Nurse unable to get vitals  Exam deferred  Assessment & Plan:  Heavy menses with dysmenorrhea- unsure if pt has ever had an exam. Given that the menses stopped and restarted and are heavy with new onset of dysmenorrhea, I would rec an EAU.  Also, will try to achieve amenorrhea with Depo Provera to help with both of the GYN issues at hand.      Rec EUA- will schedule. rec NSAIDS during her cycles Rec Depo Provera 150mg  IM  Patient desires surgical management with exam under anesthesia with labs.    Greater than 50% of the time was spent in counseling and coordination of care of the patient.   Kalem Rockwell L. Harraway-Smith, M.D., [facility]\

## 2016-07-11 ENCOUNTER — Encounter (HOSPITAL_COMMUNITY): Payer: Self-pay | Admitting: Emergency Medicine

## 2016-07-25 ENCOUNTER — Encounter (HOSPITAL_COMMUNITY): Payer: Self-pay

## 2016-07-25 NOTE — Progress Notes (Signed)
I spoke with Dr. Krista BlueSinger about Hannah Houston special needs for surgery, he states there will be no issues with giving her oral Versed prior to starting IV's etc.

## 2016-08-06 ENCOUNTER — Ambulatory Visit (HOSPITAL_COMMUNITY)
Admission: RE | Admit: 2016-08-06 | Discharge: 2016-08-06 | Disposition: A | Discharge status: Home / Self Care | Payer: Medicaid Other | Source: Ambulatory Visit | Attending: Obstetrics & Gynecology | Admitting: Obstetrics & Gynecology

## 2016-08-06 ENCOUNTER — Encounter (HOSPITAL_COMMUNITY)
Admission: RE | Disposition: A | Discharge status: Home / Self Care | Payer: Self-pay | Source: Ambulatory Visit | Attending: Obstetrics & Gynecology

## 2016-08-06 ENCOUNTER — Other Ambulatory Visit (HOSPITAL_COMMUNITY)
Admission: AD | Admit: 2016-08-06 | Discharge: 2016-08-06 | Disposition: A | Discharge status: Home / Self Care | Payer: Medicaid Other | Source: Ambulatory Visit | Attending: Family Medicine | Admitting: Family Medicine

## 2016-08-06 ENCOUNTER — Encounter (HOSPITAL_COMMUNITY): Payer: Self-pay | Admitting: Anesthesiology

## 2016-08-06 ENCOUNTER — Ambulatory Visit (HOSPITAL_COMMUNITY): Payer: Medicaid Other | Admitting: Anesthesiology

## 2016-08-06 DIAGNOSIS — Z79899 Other long term (current) drug therapy: Secondary | ICD-10-CM | POA: Diagnosis not present

## 2016-08-06 DIAGNOSIS — Z0000 Encounter for general adult medical examination without abnormal findings: Secondary | ICD-10-CM

## 2016-08-06 DIAGNOSIS — F819 Developmental disorder of scholastic skills, unspecified: Secondary | ICD-10-CM | POA: Insufficient documentation

## 2016-08-06 DIAGNOSIS — Z01419 Encounter for gynecological examination (general) (routine) without abnormal findings: Secondary | ICD-10-CM

## 2016-08-06 DIAGNOSIS — F79 Unspecified intellectual disabilities: Secondary | ICD-10-CM | POA: Insufficient documentation

## 2016-08-06 HISTORY — DX: Unspecified urinary incontinence: R32

## 2016-08-06 HISTORY — DX: Abnormal posture: R29.3

## 2016-08-06 LAB — CBC WITH DIFFERENTIAL/PLATELET
BASOS ABS: 0 10*3/uL (ref 0.0–0.1)
Basophils Absolute: 0 10*3/uL (ref 0.0–0.1)
Basophils Relative: 0 %
Basophils Relative: 0 %
EOS PCT: 2 %
EOS PCT: 2 %
Eosinophils Absolute: 0.1 10*3/uL (ref 0.0–0.7)
Eosinophils Absolute: 0.1 10*3/uL (ref 0.0–0.7)
HCT: 34.7 % — ABNORMAL LOW (ref 36.0–46.0)
HCT: 34.5 % — ABNORMAL LOW (ref 36.0–46.0)
Hemoglobin: 12.1 g/dL (ref 12.0–15.0)
Hemoglobin: 12 g/dL (ref 12.0–15.0)
LYMPHS ABS: 2.7 10*3/uL (ref 0.7–4.0)
LYMPHS PCT: 57 %
LYMPHS PCT: 54 %
Lymphs Abs: 2.9 10*3/uL (ref 0.7–4.0)
MCH: 31.3 pg (ref 26.0–34.0)
MCH: 31.2 pg (ref 26.0–34.0)
MCHC: 34.9 g/dL (ref 30.0–36.0)
MCHC: 34.8 g/dL (ref 30.0–36.0)
MCV: 89.9 fL (ref 78.0–100.0)
MCV: 89.6 fL (ref 78.0–100.0)
MONO ABS: 0.2 10*3/uL (ref 0.1–1.0)
MONO ABS: 0.3 10*3/uL (ref 0.1–1.0)
MONOS PCT: 4 %
Monocytes Relative: 7 %
Neutro Abs: 1.9 10*3/uL (ref 1.7–7.7)
Neutro Abs: 1.9 10*3/uL (ref 1.7–7.7)
Neutrophils Relative %: 37 %
Neutrophils Relative %: 37 %
PLATELETS: 215 10*3/uL (ref 150–400)
PLATELETS: 205 10*3/uL (ref 150–400)
RBC: 3.86 MIL/uL — ABNORMAL LOW (ref 3.87–5.11)
RBC: 3.85 MIL/uL — AB (ref 3.87–5.11)
RDW: 13.7 % (ref 11.5–15.5)
RDW: 13.8 % (ref 11.5–15.5)
WBC: 5.1 10*3/uL (ref 4.0–10.5)
WBC: 5.1 10*3/uL (ref 4.0–10.5)

## 2016-08-06 LAB — COMPREHENSIVE METABOLIC PANEL
ALT: 14 U/L (ref 14–54)
ANION GAP: 6 (ref 5–15)
AST: 17 U/L (ref 15–41)
Albumin: 3.9 g/dL (ref 3.5–5.0)
Alkaline Phosphatase: 36 U/L — ABNORMAL LOW (ref 38–126)
BUN: 17 mg/dL (ref 6–20)
CHLORIDE: 104 mmol/L (ref 101–111)
CO2: 27 mmol/L (ref 22–32)
Calcium: 8.8 mg/dL — ABNORMAL LOW (ref 8.9–10.3)
Creatinine, Ser: 0.54 mg/dL (ref 0.44–1.00)
Glucose, Bld: 91 mg/dL (ref 65–99)
POTASSIUM: 3.9 mmol/L (ref 3.5–5.1)
Sodium: 137 mmol/L (ref 135–145)
TOTAL PROTEIN: 7.1 g/dL (ref 6.5–8.1)
Total Bilirubin: 0.8 mg/dL (ref 0.3–1.2)

## 2016-08-06 LAB — LIPID PANEL
CHOL/HDL RATIO: 3.1 ratio
CHOLESTEROL: 135 mg/dL (ref 0–200)
HDL: 43 mg/dL (ref >40)
LDL Cholesterol: 82 mg/dL (ref 0–99)
TRIGLYCERIDES: 52 mg/dL (ref <150)
VLDL: 10 mg/dL (ref 0–40)

## 2016-08-06 LAB — IRON AND TIBC
Iron: 126 ug/dL (ref 28–170)
Saturation Ratios: 30 % (ref 10.4–31.8)
TIBC: 414 ug/dL (ref 250–450)
UIBC: 288 ug/dL

## 2016-08-06 LAB — FERRITIN: FERRITIN: 5 ng/mL — AB (ref 11–307)

## 2016-08-06 LAB — T4, FREE: FREE T4: 0.78 ng/dL (ref 0.61–1.12)

## 2016-08-06 LAB — TSH: TSH: 1.454 u[IU]/mL (ref 0.350–4.500)

## 2016-08-06 SURGERY — EXAM UNDER ANESTHESIA
Anesthesia: General | Site: Uterus

## 2016-08-06 MED ORDER — FENTANYL CITRATE (PF) 100 MCG/2ML IJ SOLN: 25.0000 ug | INTRAMUSCULAR | Status: DC | PRN

## 2016-08-06 MED ORDER — FENTANYL CITRATE (PF) 100 MCG/2ML IJ SOLN
INTRAMUSCULAR | Status: AC
  Filled 2016-08-06: qty 2

## 2016-08-06 MED ORDER — DEXAMETHASONE SODIUM PHOSPHATE 4 MG/ML IJ SOLN
INTRAMUSCULAR | Status: AC
  Filled 2016-08-06: qty 1

## 2016-08-06 MED ORDER — PROPOFOL 10 MG/ML IV BOLUS
INTRAVENOUS | Status: AC
  Filled 2016-08-06: qty 20

## 2016-08-06 MED ORDER — KETOROLAC TROMETHAMINE 30 MG/ML IJ SOLN
INTRAMUSCULAR | Status: AC
  Filled 2016-08-06: qty 1

## 2016-08-06 MED ORDER — ONDANSETRON HCL 4 MG/2ML IJ SOLN
INTRAMUSCULAR | Status: AC
  Filled 2016-08-06: qty 2

## 2016-08-06 MED ORDER — LIDOCAINE HCL (CARDIAC) 20 MG/ML IV SOLN
INTRAVENOUS | Status: AC
  Filled 2016-08-06: qty 5

## 2016-08-06 MED ORDER — MIDAZOLAM HCL 2 MG/2ML IJ SOLN
INTRAMUSCULAR | Status: AC
  Filled 2016-08-06: qty 2

## 2016-08-06 SURGICAL SUPPLY — 9 items
CLOTH BEACON ORANGE TIMEOUT ST (SAFETY) ×3 IMPLANT
GAUZE SPONGE 4X4 16PLY XRAY LF (GAUZE/BANDAGES/DRESSINGS) ×3 IMPLANT
GLOVE BIO SURGEON STRL SZ7 (GLOVE) ×3 IMPLANT
GLOVE BIOGEL PI IND STRL 7.0 (GLOVE) ×2 IMPLANT
GLOVE BIOGEL PI INDICATOR 7.0 (GLOVE) ×4
GOWN STRL REUS W/TWL LRG LVL3 (GOWN DISPOSABLE) ×6 IMPLANT
GOWN STRL REUS W/TWL XL LVL3 (GOWN DISPOSABLE) ×3 IMPLANT
SCOPETTES 8  STERILE (MISCELLANEOUS)
SCOPETTES 8 STERILE (MISCELLANEOUS) IMPLANT

## 2016-08-06 NOTE — Anesthesia Postprocedure Evaluation (Signed)
Anesthesia Post Note  Patient: Hannah Houston  Procedure(s) Performed: Procedure(s) (LRB): EXAM UNDER ANESTHESIA W/ LABS (N/A)  Patient location during evaluation: PACU Anesthesia Type: General Level of consciousness: awake and alert Pain management: pain level controlled Vital Signs Assessment: post-procedure vital signs reviewed and stable Respiratory status: spontaneous breathing, nonlabored ventilation and respiratory function stable Cardiovascular status: blood pressure returned to baseline and stable Postop Assessment: no signs of nausea or vomiting Anesthetic complications: no        Last Vitals:  Vitals:   08/06/16 0904 08/06/16 1006  BP:  (P) 108/68  Pulse:  (P) 71  Resp: 18 (P) 16  Temp:  (P) 36.1 C    Last Pain: There were no vitals filed for this visit. Pain Goal: Patients Stated Pain Goal:  (develop delay and nonverbal) (08/06/16 0904)               Tyquez Hollibaugh A.

## 2016-08-06 NOTE — Brief Op Note (Signed)
08/06/2016  10:28 AM  PATIENT:  Hannah Houston  46 y.o. female  PRE-OPERATIVE DIAGNOSIS:  Mental Retardation  POST-OPERATIVE DIAGNOSIS:  Mental Retardation  PROCEDURE:  Procedure(s) with comments: EXAM UNDER ANESTHESIA W/ LABS (N/A) - severe mental delay, nonverbal learning disability  SURGEON:  Surgeon(s) and Role:    * Lavonia Drafts, MD - Primary  ANESTHESIA:   MAC  EBL:  No intake/output data recorded.  BLOOD ADMINISTERED:none  DRAINS: none   LOCAL MEDICATIONS USED:  NONE  SPECIMEN:  Source of Specimen:  cervical cytology  DISPOSITION OF SPECIMEN:  PATHOLOGY  COUNTS:  YES  TOURNIQUET:  * No tourniquets in log *  DICTATION: .Note written in EPIC  PLAN OF CARE: Discharge to home after PACU  PATIENT DISPOSITION:  PACU - hemodynamically stable.   Delay start of Pharmacological VTE agent (>24hrs) due to surgical blood loss or risk of bleeding: not applicable

## 2016-08-06 NOTE — Anesthesia Preprocedure Evaluation (Addendum)
Anesthesia Evaluation  Patient identified by MRN, date of birth, ID band Patient awake    Reviewed: Allergy & Precautions, NPO status , Patient's Chart, lab work & pertinent test results  Airway      Mouth opening: Limited Mouth Opening  Dental  (+) Poor Dentition   Pulmonary neg pulmonary ROS,    Pulmonary exam normal breath sounds clear to auscultation       Cardiovascular negative cardio ROS Normal cardiovascular exam Rhythm:Regular Rate:Normal     Neuro/Psych PSYCHIATRIC DISORDERS Profound Mental Retardationnegative neurological ROS     GI/Hepatic Neg liver ROS, GERD  Medicated and Controlled,  Endo/Other  negative endocrine ROS  Renal/GU negative Renal ROS  negative genitourinary   Musculoskeletal negative musculoskeletal ROS (+)   Abdominal   Peds  Hematology  (+) anemia ,   Anesthesia Other Findings   Reproductive/Obstetrics                             Anesthesia Physical  Anesthesia Plan  ASA: III  Anesthesia Plan: General   Post-op Pain Management:    Induction: Inhalational  Airway Management Planned: Mask  Additional Equipment:   Intra-op Plan:   Post-operative Plan:   Informed Consent: I have reviewed the patients History and Physical, chart, labs and discussed the procedure including the risks, benefits and alternatives for the proposed anesthesia with the patient or authorized representative who has indicated his/her understanding and acceptance.   Dental advisory given  Plan Discussed with: CRNA, Anesthesiologist and Surgeon  Anesthesia Plan Comments: (Will draw labs while patient under anesthesia.)                                        Anesthesia Evaluation  Patient identified by MRN, date of birth, ID band Patient awake    Reviewed: Allergy & Precautions, NPO status , Patient's Chart, lab work & pertinent test results  Airway      Mouth  opening: Limited Mouth Opening  Dental  (+) Poor Dentition   Pulmonary    Pulmonary exam normal breath sounds clear to auscultation       Cardiovascular negative cardio ROS Normal cardiovascular exam Rhythm:Regular Rate:Normal     Neuro/Psych PSYCHIATRIC DISORDERS Profound Mental Retardation   GI/Hepatic Neg liver ROS, GERD  Medicated and Controlled,  Endo/Other  negative endocrine ROS  Renal/GU negative Renal ROS  negative genitourinary   Musculoskeletal negative musculoskeletal ROS (+)   Abdominal   Peds  Hematology  (+) anemia ,   Anesthesia Other Findings   Reproductive/Obstetrics                           Anesthesia Physical Anesthesia Plan  ASA: III  Anesthesia Plan: General   Post-op Pain Management:    Induction: Intravenous  Airway Management Planned: Nasal ETT  Additional Equipment:   Intra-op Plan:   Post-operative Plan: Extubation in OR  Informed Consent: I have reviewed the patients History and Physical, chart, labs and discussed the procedure including the risks, benefits and alternatives for the proposed anesthesia with the patient or authorized representative who has indicated his/her understanding and acceptance.   Dental advisory given  Plan Discussed with: CRNA, Anesthesiologist and Surgeon  Anesthesia Plan Comments: (Dr. Katina Dung faxed over an order for labs to be drawn while patient is under  anesthesia. Shonna ChockAllison Zelenak, PA-C)       Anesthesia Quick Evaluation                                   Anesthesia Evaluation  Patient identified by MRN, date of birth, ID band Patient unresponsive    Airway       Comment: Unable to assess Dental   Pulmonary          Cardiovascular     Neuro/Psych PSYCHIATRIC DISORDERS    GI/Hepatic GERD-  Medicated,  Endo/Other    Renal/GU      Musculoskeletal   Abdominal   Peds  Hematology  (+) anemia ,   Anesthesia Other  Findings Profound mental retardation  Reproductive/Obstetrics                             Anesthesia Physical Anesthesia Plan  ASA: III  Anesthesia Plan: General ETT   Post-op Pain Management:    Induction:   Airway Management Planned:   Additional Equipment:   Intra-op Plan:   Post-operative Plan:   Informed Consent:   Dental Advisory Given  Plan Discussed with: Anesthesiologist, CRNA and Surgeon  Anesthesia Plan Comments:         Anesthesia Quick Evaluation  Anesthesia Quick Evaluation                                   Anesthesia Evaluation  Patient identified by MRN, date of birth, ID band Patient unresponsive    Airway       Comment: Unable to assess Dental   Pulmonary          Cardiovascular     Neuro/Psych PSYCHIATRIC DISORDERS    GI/Hepatic GERD-  Medicated,  Endo/Other    Renal/GU      Musculoskeletal   Abdominal   Peds  Hematology  (+) anemia ,   Anesthesia Other Findings Profound mental retardation  Reproductive/Obstetrics                             Anesthesia Physical Anesthesia Plan  ASA: III  Anesthesia Plan: General ETT   Post-op Pain Management:    Induction:   Airway Management Planned:   Additional Equipment:   Intra-op Plan:   Post-operative Plan:   Informed Consent:   Dental Advisory Given  Plan Discussed with: Anesthesiologist, CRNA and Surgeon  Anesthesia Plan Comments:         Anesthesia Quick Evaluation

## 2016-08-06 NOTE — Transfer of Care (Signed)
Immediate Anesthesia Transfer of Care Note  Patient: Hannah Houston  Procedure(s) Performed: Procedure(s) with comments: EXAM UNDER ANESTHESIA W/ LABS (N/A) - severe mental delay, nonverbal learning disability  Patient Location: PACU  Anesthesia Type:General  Level of Consciousness: awake and drowsy  Airway & Oxygen Therapy: Patient Spontanous Breathing and Patient connected to nasal cannula oxygen  Post-op Assessment: Report given to RN and Post -op Vital signs reviewed and stable  Post vital signs: Reviewed and stable  Last Vitals:  Vitals:   08/06/16 0904  Resp: 18    Last Pain: There were no vitals filed for this visit.    Patients Stated Pain Goal:  (develop delay and nonverbal) (08/06/16 0904)  Complications: No apparent anesthesia complications

## 2016-08-06 NOTE — Progress Notes (Signed)
Patient is nonverbal , woke up quickly, caregiver dressed her and wheelchair patient to car. Alert and oriented to her normal . Matilde BashN Abbott Jasinski, RN

## 2016-08-06 NOTE — Op Note (Signed)
08/06/2016  10:28 AM  PATIENT:  Hannah Houston  45 y.o. female  PRE-OPERATIVE DIAGNOSIS:  Mental Retardation  POST-OPERATIVE DIAGNOSIS:  Mental Retardation  PROCEDURE:  Procedure(s) with comments: EXAM UNDER ANESTHESIA W/ LABS (N/A) - severe mental delay, nonverbal learning disability  SURGEON:  Surgeon(s) and Role:    * Lavonia Drafts, MD - Primary  ANESTHESIA:   MAC  EBL:  No intake/output data recorded.  BLOOD ADMINISTERED:none  DRAINS: none   LOCAL MEDICATIONS USED:  NONE  SPECIMEN:  Source of Specimen:  cervical cytology  DISPOSITION OF SPECIMEN:  PATHOLOGY  COUNTS:  YES  TOURNIQUET:  * No tourniquets in log *  DICTATION: .Note written in EPIC  PLAN OF CARE: Discharge to home after PACU  PATIENT DISPOSITION:  PACU - hemodynamically stable.   Delay start of Pharmacological VTE agent (>24hrs) due to surgical blood loss or risk of bleeding: not applicable  Exam under anesthesia was preformed  General Appearance:     appears stated age; well developed 46 yo female  Head:    Normocephalic, without obvious abnormality, atraumatic  Eyes:    conjunctiva/corneas clear, EOM's intact, both eyes  Ears:    Normal external ear canals, both ears  Nose:   Nares normal, septum midline, mucosa normal, no drainage    or sinus tenderness  Throat:   Lips, mucosa, and tongue normal; teeth and gums normal  Neck:   Supple, symmetrical, trachea midline, no adenopathy;    thyroid:  no enlargement/tenderness/nodules  Back:     Symmetric, no curvature, ROM normal, no CVA tenderness  Lungs:     Clear to auscultation bilaterally, respirations unlabored  Chest Wall:    No tenderness or deformity   Heart:    Regular rate and rhythm, S1 and S2 normal, no murmur, rub   or gallop  Breast Exam:    No masses, or nipple abnormality; no axillary lymphadenopathy.  Abdomen:     Soft normal bowel sounds active all four quadrants,    no masses, no organomegaly  Genitalia:    Normal  female without lesion or discharge; very narrow introitus. Pediatric speculum used       Extremities:   Extremities normal, atraumatic, no cyanosis or edema  Pulses:   2+ and symmetric all extremities  Skin:   Skin color, texture, turgor normal, there is a rash on the breasts and the upper thighs- this is a red papular rash. This is not extensive.  No skin breakdown noted.  Labs drawn under anesthesia              There were no complications. The pt tolerated the procedure well.  Briscoe Daniello L. Harraway-Smith, M.D., [facility] June

## 2016-08-06 NOTE — H&P (Signed)
Preoperative History and Physical  Hannah Houston is a 46 y.o. No obstetric history on file. here for surgical management of unable to tolerate awake exam.  Proposed surgery: Exam under anesthesia with lab draw  Past Medical History:  Diagnosis Date  . Anemia   . Gastritis   . GERD (gastroesophageal reflux disease)   . Intellectual disability   . Iron deficiency   . Mental retardation   . Non-verbal learning disorder   . Poor posture    walks bent over  . Urine incontinence    occassional, uses depends   Past Surgical History:  Procedure Laterality Date  . DENTAL RESTORATION/EXTRACTION WITH X-RAY N/A 05/06/2014   Procedure: CLEANING XRAY, DENTAL RESTORATION AND EXTRACTIONS;  Surgeon: Esaw Dace., DDS;  Location: [facility] OR;  Service: Oral Surgery;  Laterality: N/A;  Recall Exam, Full mouth xrays and full mouth debridement,  Glass ionomers - #2 OF; #3 OL; #4 O; #6 F; #12 O; #13 O; #14 OFL; #20 DO Extractions - 12 Prime - #14, 15, 18, 19, 23, 27, 28, 29, 30, 31  . DENTAL RESTORATION/EXTRACTION WITH X-RAY N/A 06/15/2015   Procedure: DENTAL RESTORATION; EXTRACTION OF TEETH NUMBER TWO AND EIGHTEEN WITH X-RAY and cleaning;  Surgeon: Boneta Lucks, DDS;  Location: [facility] OR;  Service: Oral Surgery;  Laterality: N/A;  . NO PAST SURGERIES     OB History    No data available     Patient denies any cervical dysplasia or STIs. Prescriptions Prior to Admission  Medication Sig Dispense Refill Last Dose  . acetaminophen (TYLENOL) 160 MG/5ML solution Take 640 mg by mouth every 4 (four) hours as needed for moderate pain or fever.   Taking  . acetaminophen (TYLENOL) 325 MG tablet Take 650 mg by mouth every 4 (four) hours as needed for mild pain, moderate pain, fever or headache.   Taking  . cholecalciferol (VITAMIN D) 1000 units tablet Take 1,000 Units by mouth daily.   Taking  . diphenhydrAMINE (BENADRYL) 25 mg capsule Take 25 mg by mouth every 4 (four) hours as needed for itching or  allergies.    Taking  . divalproex (DEPAKOTE SPRINKLE) 125 MG capsule Take 250 mg by mouth 2 (two) times daily.   Taking  . DULoxetine (CYMBALTA) 60 MG capsule Take 60 mg by mouth daily.   Taking  . LORazepam (ATIVAN) 1 MG tablet Take 1-2 mg by mouth 3 (three) times daily. Takes 1 mg in the morning at 8:00 am, 2 mg in the afternoon at 1400 and 2 mg in the evening 2000 And 1 tablet 30 minutes prior to procedure   Taking  . Melatonin 3 MG CAPS Take 1 capsule by mouth at bedtime.   Taking  . QUEtiapine (SEROQUEL) 25 MG tablet Take 25 mg by mouth 2 (two) times daily.    Taking  . senna (SENOKOT) 8.6 MG TABS tablet Take 1 tablet by mouth at bedtime.   Taking  . vitamin B-12 (CYANOCOBALAMIN) 500 MCG tablet Take 500 mcg by mouth daily.   Taking  . Vitamins A & D (VITAMIN A & D) ointment Apply 1 application topically daily. To lower legs and arms daily   Taking  . albuterol (PROVENTIL HFA;VENTOLIN HFA) 108 (90 Base) MCG/ACT inhaler Inhale 2 puffs into the lungs every 4 (four) hours as needed for wheezing or shortness of breath.   Taking  . amLODipine (NORVASC) 2.5 MG tablet Take 2.5 mg by mouth every evening.   Taking  . baclofen (  LIORESAL) 10 MG tablet Take 10 mg by mouth 3 (three) times daily.   Taking  . bisacodyl (DULCOLAX) 10 MG suppository Place 10 mg rectally as needed for moderate constipation.   Taking  . carbamide peroxide (DEBROX) 6.5 % otic solution Place 5 drops into both ears as needed (earwax buildup).   Taking  . hydrocortisone cream 1 % Apply 1 application topically 2 (two) times daily as needed for itching.   Taking  . loperamide (IMODIUM A-D) 2 MG tablet Take 4 mg by mouth 4 (four) times daily as needed for diarrhea or loose stools.   Taking  . montelukast (SINGULAIR) 10 MG tablet Take 10 mg by mouth at bedtime.   Taking  . Mouthwashes (LISTERINE MT) Use as directed 5 mLs in the mouth or throat 2 (two) times daily. Brush 5mL into gums/teeth twice daily   Taking  . polyethylene glycol  (MIRALAX / GLYCOLAX) packet Take 17 g by mouth every Monday, Wednesday, and Friday.   Taking  . promethazine (PHENERGAN) 25 MG tablet Take 25 mg by mouth every 4 (four) hours as needed for nausea or vomiting.   Taking  . Pseudoephedrine HCl (DIMETAPP PEDIATRIC PO) Take 10-20 mLs by mouth every 4 (four) hours as needed (nasal congestion). 20mL if over 100lbs, 10mL if under 100lbs   Taking    Allergies  Allergen Reactions  . Codeine Other (See Comments)    Unknown   Social History:   reports that she has never smoked. She has never used smokeless tobacco. She reports that she does not drink alcohol or use drugs. History reviewed. No pertinent family history.  Review of Systems: Noncontributory  PHYSICAL EXAM: Pt declined vitals  CONSTITUTIONAL: Well-developed, well-nourished female in no acute distress.  HENT:  Normocephalic, atraumatic EYES: Conjunctivae and EOM are normal. No scleral icterus.  NECK: Normal range of motion SKIN: Skin is warm and dry. No rash noted. Not diaphoretic.No pallor. NEUROLGIC: Alert and oriented to person, place, and time. Normal coordination.    Labs: No results found for this or any previous visit (from the past 336 hour(s)).  Imaging Studies: No results found.  Assessment: There are no active problems to display for this patient.   Plan: Patient will undergo surgical management with Exam under anesthesia with lab draw.   Patient has been NPO since last night she will remain NPO for procedure.  Anesthesia and OR aware.  Preoperative prophylactic SCDs ordered on call to the OR.  To OR when ready.  Malikah Lakey L. Erin FullingHarraway-Smith, M.D., [facility] Center[facility] 08/06/2016 8:49 AM

## 2016-08-07 LAB — VITAMIN D 25 HYDROXY (VIT D DEFICIENCY, FRACTURES): VIT D 25 HYDROXY: 41.8 ng/mL (ref 30.0–100.0)

## 2016-08-07 LAB — HEMOGLOBIN A1C
Hgb A1c MFr Bld: 5.1 % (ref 4.8–5.6)
MEAN PLASMA GLUCOSE: 100 mg/dL

## 2016-08-07 LAB — CYTOLOGY - PAP: DIAGNOSIS: NEGATIVE

## 2017-10-23 ENCOUNTER — Other Ambulatory Visit: Payer: Self-pay | Admitting: Family Medicine

## 2017-10-23 DIAGNOSIS — Z1231 Encounter for screening mammogram for malignant neoplasm of breast: Secondary | ICD-10-CM

## 2017-11-20 ENCOUNTER — Ambulatory Visit
Admission: RE | Admit: 2017-11-20 | Discharge: 2017-11-20 | Disposition: A | Discharge status: Home / Self Care | Payer: Medicaid Other | Source: Ambulatory Visit | Attending: Family Medicine | Admitting: Family Medicine

## 2017-11-20 DIAGNOSIS — Z1231 Encounter for screening mammogram for malignant neoplasm of breast: Secondary | ICD-10-CM

## 2017-11-26 ENCOUNTER — Encounter (HOSPITAL_COMMUNITY): Payer: Self-pay

## 2017-11-26 ENCOUNTER — Other Ambulatory Visit: Payer: Self-pay

## 2017-11-26 ENCOUNTER — Emergency Department (HOSPITAL_COMMUNITY)
Admission: EM | Admit: 2017-11-26 | Discharge: 2017-11-26 | Disposition: A | Discharge status: Home / Self Care | Payer: Medicaid Other | Attending: Emergency Medicine | Admitting: Emergency Medicine

## 2017-11-26 DIAGNOSIS — B9789 Other viral agents as the cause of diseases classified elsewhere: Secondary | ICD-10-CM

## 2017-11-26 DIAGNOSIS — J069 Acute upper respiratory infection, unspecified: Secondary | ICD-10-CM | POA: Insufficient documentation

## 2017-11-26 DIAGNOSIS — Z79899 Other long term (current) drug therapy: Secondary | ICD-10-CM | POA: Insufficient documentation

## 2017-11-26 DIAGNOSIS — F79 Unspecified intellectual disabilities: Secondary | ICD-10-CM | POA: Insufficient documentation

## 2017-11-26 DIAGNOSIS — R05 Cough: Secondary | ICD-10-CM | POA: Diagnosis present

## 2017-11-26 LAB — INFLUENZA PANEL BY PCR (TYPE A & B)
Influenza A By PCR: NEGATIVE
Influenza B By PCR: NEGATIVE

## 2017-11-26 MED ORDER — SODIUM CHLORIDE 0.9 % IV BOLUS: 1000.0000 mL | Freq: Once | INTRAVENOUS | Status: DC

## 2017-11-26 NOTE — ED Notes (Signed)
Pt would not let me get vital signs, Pt dressed out into a gown but when the blood pressure cuff starting getting tight that's when the pt tried to take the cuff off also she wouldn't allow me to get a temp.

## 2017-11-26 NOTE — ED Notes (Signed)
Unable to obtain vital signs due to pt behavior. Caregiver states this is normal behavior

## 2017-11-26 NOTE — Discharge Instructions (Signed)
Hannah Houston has normal vitals here today and appears very well-hydrated, flu test was negative and she is eating and drinking well here.  This is likely a viral upper respiratory infection.  You can treat this supportively with Tylenol as needed, and over-the-counter cough syrups.  Return for persistent fevers, chest pain, shortness of breath, worsening cough, if she is not eating or drinking well or any other new or concerning symptoms occur.

## 2017-11-26 NOTE — ED Provider Notes (Signed)
[facility]-EMERGENCY DEPT Provider Note   CSN: [id number] Arrival date & time: 11/26/17  1738     History   Chief Complaint Chief Complaint  Patient presents with  . Dehydration    HPI Hannah Houston is a 47 y.o. female.  Hannah Houston is a 47 y.o. Female who is known nonverbal with history of MR, GERD, and iron deficiency, who presents to the emergency department from her group home for evaluation of cough, congestion with concern for influenza.  Staff report over the past 2 days she has had some low-grade fevers, 99-100, has been intermittently coughing with some congestion.  She has not been exhibiting any increased work of breathing, no vomiting, not complaining of any abdominal pain.  Morning staff reported she was not eating and drinking as well by afternoon staff reports she has been eating and drinking normally, staff is present with her at the bedside and feel that her behavior is at baseline.  In triage patient was pushing away and kicking at staff members, and refusing vitals or any sort of intervention.  Home staff report they did do a chest x-ray yesterday which showed no evidence of pneumonia or other acute cardiopulmonary disease.  They report she typically becomes combative when at any doctor's office or medical facility.  Level 5 caveat: Patient nonverbal     Past Medical History:  Diagnosis Date  . Anemia   . Gastritis   . GERD (gastroesophageal reflux disease)   . Intellectual disability   . Iron deficiency   . Mental retardation   . Non-verbal learning disorder   . Poor posture    walks bent over  . Urine incontinence    occassional, uses depends    Patient Active Problem List   Diagnosis Date Noted  . Encounter for gynecological examination with Papanicolaou smear of cervix 08/06/2016    Past Surgical History:  Procedure Laterality Date  . DENTAL RESTORATION/EXTRACTION WITH X-RAY N/A 05/06/2014   Procedure: CLEANING  XRAY, DENTAL RESTORATION AND EXTRACTIONS;  Surgeon: Esaw Dace., DDS;  Location: [facility] OR;  Service: Oral Surgery;  Laterality: N/A;  Recall Exam, Full mouth xrays and full mouth debridement,  Glass ionomers - #2 OF; #3 OL; #4 O; #6 F; #12 O; #13 O; #14 OFL; #20 DO Extractions - 12 Prime - #14, 15, 18, 19, 23, 27, 28, 29, 30, 31  . DENTAL RESTORATION/EXTRACTION WITH X-RAY N/A 06/15/2015   Procedure: DENTAL RESTORATION; EXTRACTION OF TEETH NUMBER TWO AND EIGHTEEN WITH X-RAY and cleaning;  Surgeon: Boneta Lucks, DDS;  Location: [facility] OR;  Service: Oral Surgery;  Laterality: N/A;  . NO PAST SURGERIES       OB History   None      Home Medications    Prior to Admission medications   Medication Sig Start Date End Date Taking? Authorizing Provider  acetaminophen (TYLENOL) 160 MG/5ML solution Take 640 mg by mouth every 4 (four) hours as needed for moderate pain or fever.    [provider]  acetaminophen (TYLENOL) 325 MG tablet Take 650 mg by mouth every 4 (four) hours as needed for mild pain, moderate pain, fever or headache.    [provider]  albuterol (PROVENTIL HFA;VENTOLIN HFA) 108 (90 Base) MCG/ACT inhaler Inhale 2 puffs into the lungs every 4 (four) hours as needed for wheezing or shortness of breath.    [provider]  amLODipine (NORVASC) 2.5 MG tablet Take 2.5 mg by mouth every evening.  [provider]  baclofen (LIORESAL) 10 MG tablet Take 10 mg by mouth 3 (three) times daily.    [provider]  bisacodyl (DULCOLAX) 10 MG suppository Place 10 mg rectally as needed for moderate constipation.    [provider]  carbamide peroxide (DEBROX) 6.5 % otic solution Place 5 drops into both ears as needed (earwax buildup).    [provider]  cholecalciferol (VITAMIN D) 1000 units tablet Take 1,000 Units by mouth daily.    [provider]  diphenhydrAMINE (BENADRYL) 25 mg capsule Take 25 mg by mouth every 4 (four)  hours as needed for itching or allergies.     [provider]  divalproex (DEPAKOTE SPRINKLE) 125 MG capsule Take 250 mg by mouth 2 (two) times daily.    [provider]  DULoxetine (CYMBALTA) 60 MG capsule Take 60 mg by mouth daily.    [provider]  hydrocortisone cream 1 % Apply 1 application topically 2 (two) times daily as needed for itching.    [provider]  loperamide (IMODIUM A-D) 2 MG tablet Take 4 mg by mouth 4 (four) times daily as needed for diarrhea or loose stools.    [provider]  LORazepam (ATIVAN) 1 MG tablet Take 1-2 mg by mouth 3 (three) times daily. Takes 1 mg in the morning at 8:00 am, 2 mg in the afternoon at 1400 and 2 mg in the evening 2000 And 1 tablet 30 minutes prior to procedure    [provider]  Melatonin 3 MG CAPS Take 1 capsule by mouth at bedtime.    [provider]  montelukast (SINGULAIR) 10 MG tablet Take 10 mg by mouth at bedtime.    [provider]  Mouthwashes (LISTERINE MT) Use as directed 5 mLs in the mouth or throat 2 (two) times daily. Brush 5mL into gums/teeth twice daily    [provider]  polyethylene glycol (MIRALAX / GLYCOLAX) packet Take 17 g by mouth every Monday, Wednesday, and Friday.    [provider]  promethazine (PHENERGAN) 25 MG tablet Take 25 mg by mouth every 4 (four) hours as needed for nausea or vomiting.    [provider]  Pseudoephedrine HCl (DIMETAPP PEDIATRIC PO) Take 10-20 mLs by mouth every 4 (four) hours as needed (nasal congestion). 20mL if over 100lbs, 10mL if under 100lbs    [provider]  QUEtiapine (SEROQUEL) 25 MG tablet Take 25 mg by mouth 2 (two) times daily.     [provider]  senna (SENOKOT) 8.6 MG TABS tablet Take 1 tablet by mouth at bedtime.    [provider]  vitamin B-12 (CYANOCOBALAMIN) 500 MCG tablet Take 500 mcg by mouth daily.    [provider]  Vitamins A & D  (VITAMIN A & D) ointment Apply 1 application topically daily. To lower legs and arms daily    [provider]    Family History No family history on file.  Social History Social History   Tobacco Use  . Smoking status: Never Smoker  . Smokeless tobacco: Never Used  Substance Use Topics  . Alcohol use: No  . Drug use: No     Allergies   Codeine   Review of Systems Review of Systems  Unable to perform ROS: Patient nonverbal     Physical Exam Updated Vital Signs Resp 19   Ht 4\' 11"  (1.499 m)   Wt 54.9 kg   BMI 24.45 kg/m   Physical Exam  Constitutional: She appears well-developed and well-nourished. No distress.  Patient is very well-appearing, active and alert, no acute distress  HENT:  Head: Normocephalic and atraumatic.  TMs clear with good landmarks, moderate nasal mucosa edema with clear rhinorrhea, posterior oropharynx clear and moist, with some erythema, no edema or exudates  Eyes: Right eye exhibits no discharge. Left eye exhibits no discharge.  Neck: Neck supple.  Cardiovascular: Normal rate, regular rhythm, normal heart sounds and intact distal pulses.  Pulmonary/Chest: Effort normal and breath sounds normal. No respiratory distress.  Respirations equal and unlabored, patient able to speak in full sentences, lungs clear to auscultation bilaterally, intermittent dry cough  Abdominal: Soft. Bowel sounds are normal. She exhibits no distension and no mass. There is no tenderness. There is no guarding.  Abdomen soft, nondistended, nontender to palpation in all quadrants without guarding or peritoneal signs  Musculoskeletal: She exhibits no edema or deformity.  Lymphadenopathy:    She has no cervical adenopathy.  Neurological: She is alert. Coordination normal.  Skin: Skin is warm and dry. Capillary refill takes less than 2 seconds. She is not diaphoretic.  Psychiatric: She has a normal mood and affect. Her behavior is normal.  Nursing note and vitals  reviewed.    ED Treatments / Results  Labs (all labs ordered are listed, but only abnormal results are displayed) Labs Reviewed  INFLUENZA PANEL BY PCR (TYPE A & B)    EKG None  Radiology No results found.  Procedures Procedures (including critical care time)  Medications Ordered in ED Medications - No data to display   Initial Impression / Assessment and Plan / ED Course  I have reviewed the triage vital signs and the nursing notes.  Pertinent labs & imaging results that were available during my care of the patient were reviewed by me and considered in my medical decision making (see chart for details).  Patient with history of MR, nonverbal, presents from group home with concern for influenza, she has had 2 days of low-grade fever, cough and congestion.  Chest x-ray done at facility was negative.  She was initially drinking less than usual, but at facility this afternoon has been eating and drinking well.  Behavior: Per facility staff who have accompanied her to the emergency department she is afebrile with normal vital signs here and is well-appearing with clear lungs, benign abdomen, heart with regular rate and rhythm, exam most consistent with URI, no concern for meningitis, no evidence of otitis.  Will check influenza swab.  Initially plan to do CBC, BMP and urinalysis, but patient is not cooperative with this, and given her well appearance I do not feel it is necessary to sedate the patient to obtain these labs.  Patient's influenza test is negative, she has drink three 8 ounce glasses of juice here in the emergency department and had pudding without difficulty.  Discussed with facility that feel she is stable for discharge home with continued supportive treatment, encouraging p.o. fluids for hydration.  If she develops high fevers or any other abnormal vital signs return to the emergency department for reevaluation.  Facility and staff expressed understanding and are in  agreement with plan.  Final Clinical Impressions(s) / ED Diagnoses   Final diagnoses:  Viral URI with cough    ED Discharge Orders    None       Legrand Rams 11/27/17 1541    Linwood Dibbles, MD 11/27/17 1649

## 2017-11-26 NOTE — ED Triage Notes (Addendum)
Pt coming from [facility], concerned for fever and r/o flu. Group home staff, NT, and RN attempted to get vital signs without success. Per caregiver, fever was 99, then 100, then back to 99. Pt kicking and swinging arms while attempting to get vitals.

## 2017-11-26 NOTE — ED Notes (Signed)
Pt drank 2 8oz apple juices and a container of chocolate pudding.

## 2018-08-24 ENCOUNTER — Emergency Department (HOSPITAL_COMMUNITY): Payer: Medicare Other

## 2018-08-24 ENCOUNTER — Other Ambulatory Visit: Payer: Self-pay

## 2018-08-24 ENCOUNTER — Emergency Department (HOSPITAL_COMMUNITY)
Admission: EM | Admit: 2018-08-24 | Discharge: 2018-08-24 | Disposition: A | Discharge status: Home / Self Care | Payer: Medicare Other | Attending: Emergency Medicine | Admitting: Emergency Medicine

## 2018-08-24 ENCOUNTER — Encounter (HOSPITAL_COMMUNITY): Payer: Self-pay

## 2018-08-24 DIAGNOSIS — J40 Bronchitis, not specified as acute or chronic: Secondary | ICD-10-CM | POA: Diagnosis not present

## 2018-08-24 DIAGNOSIS — U071 COVID-19: Secondary | ICD-10-CM | POA: Diagnosis not present

## 2018-08-24 DIAGNOSIS — Z79899 Other long term (current) drug therapy: Secondary | ICD-10-CM | POA: Insufficient documentation

## 2018-08-24 DIAGNOSIS — R05 Cough: Secondary | ICD-10-CM

## 2018-08-24 DIAGNOSIS — F79 Unspecified intellectual disabilities: Secondary | ICD-10-CM | POA: Diagnosis not present

## 2018-08-24 DIAGNOSIS — R509 Fever, unspecified: Secondary | ICD-10-CM | POA: Diagnosis present

## 2018-08-24 DIAGNOSIS — R059 Cough, unspecified: Secondary | ICD-10-CM

## 2018-08-24 LAB — SARS CORONAVIRUS 2 BY RT PCR (HOSPITAL ORDER, PERFORMED IN ~~LOC~~ HOSPITAL LAB): SARS Coronavirus 2: POSITIVE — AB

## 2018-08-24 MED ORDER — ACETAMINOPHEN 325 MG PO TABS: 650.0000 mg | ORAL_TABLET | Freq: Once | ORAL | Status: DC | PRN

## 2018-08-24 NOTE — ED Notes (Signed)
Bed: QZ00 Expected date:  Expected time:  Means of arrival:  Comments: 15F cough

## 2018-08-24 NOTE — ED Notes (Signed)
Spoke with Sydnee Cabal at group home- states pt transportation "should have been there by now"- is going to find out status

## 2018-08-24 NOTE — ED Notes (Signed)
Received call from [place] at group home. She started call saying pt cannot come back. This RN inquired about transportation, stating we can go back [facility] is an option if no transport avail.

## 2018-08-24 NOTE — ED Notes (Signed)
Pt is alert, sitting up in bed watching TV, NAD noted. Pt had removed hospital gown and socks and thrown them on the floor, so pt was given her shirt to put back on and a warm blanket. Pt appears to be comfortable

## 2018-08-24 NOTE — ED Notes (Signed)
Pt ate approx 2/3 of sandwich once it was pulled apart into little pieces and handed her, drank approx. 4 oz. of water.

## 2018-08-24 NOTE — ED Notes (Addendum)
Received call from [place], stating that transportation is in the works currently.

## 2018-08-24 NOTE — ED Notes (Signed)
Unable to get CXR  Due to pt being combative- will not allow tech to place board behind her, will not sit still.

## 2018-08-24 NOTE — ED Notes (Signed)
Pt provided with a [place] sandwich and water. Pt smiled when offered food, but pushed away when handed to her. Sandwich is sitting on bedside table with ice water, warm blanket also provided.

## 2018-08-24 NOTE — ED Notes (Signed)
Pt got up out of bed and opened the door, walking towards nursing station. Pt was quickly re-directed back to her room. BSC provided and pt sat down for several minutes, but did not urinate. Pt brief was still dry. Safety sitter outside door to prevent pt was leaving room again.

## 2018-08-24 NOTE — Discharge Instructions (Addendum)
° ° °Person Under Monitoring Name: Hannah Houston ° °Location: [facility] °[address] °[place] [address] ° ° °Infection Prevention Recommendations for Individuals Confirmed to have, °or Being Evaluated for, 2019 Novel Coronavirus (COVID-19) Infection Who °Receive Care at Home ° °Individuals who are confirmed to have, or are being evaluated for, COVID-19 should follow the prevention steps below °until a healthcare provider or local or state health department says they can return to normal activities. ° °Stay home except to get medical care °You should restrict activities outside your home, except for getting medical care. Do not go to work, school, or public °areas, and do not use public transportation or taxis. ° °Call ahead before visiting your doctor °Before your medical appointment, call the healthcare provider and tell them that you have, or are being evaluated for, °COVID-19 infection. This will help the healthcare provider’s office take steps to keep other people from getting infected. °Ask your healthcare provider to call the local or state health department. ° °Monitor your symptoms °Seek prompt medical attention if your illness is worsening (e.g., difficulty breathing). Before going to your medical °appointment, call the healthcare provider and tell them that you have, or are being evaluated for, COVID-19 infection. Ask °your healthcare provider to call the local or state health department. ° °Wear a facemask °You should wear a facemask that covers your nose and mouth when you are in the same room with other people and °when you visit a healthcare provider. People who live with or visit you should also wear a facemask while they are in the °same room with you. ° °Separate yourself from other people in your home °As much as possible, you should stay in a different room from other people in your home. Also, you should use a separate °bathroom, if available. ° °Avoid sharing household  items °You should not share dishes, drinking glasses, cups, eating utensils, towels, bedding, or other items with other people in °your home. After using these items, you should wash them thoroughly with soap and water. ° °Cover your coughs and sneezes °Cover your mouth and nose with a tissue when you cough or sneeze, or you can cough or sneeze into your sleeve. Throw °used tissues in a lined trash can, and immediately wash your hands with soap and water for at least 20 seconds or use an °alcohol-based hand rub. ° °Wash your hands °Wash your hands often and thoroughly with soap and water for at least 20 seconds. You can use an alcohol-based hand °sanitizer if soap and water are not available and if your hands are not visibly dirty. Avoid touching your eyes, nose, and °mouth with unwashed hands. ° ° °Prevention Steps for Caregivers and Household Members of °Individuals Confirmed to have, or Being Evaluated for, COVID-19 Infection Being Cared for in the Home ° °If you live with, or provide care at home for, a person confirmed to have, or being evaluated for, COVID-19 infection °please follow these guidelines to prevent infection: ° °Follow healthcare provider’s instructions °Make sure that you understand and can help the patient follow any healthcare provider instructions for all care. ° °Provide for the patient’s basic needs °You should help the patient with basic needs in the home and provide support for getting groceries, prescriptions, and °other personal needs. ° °Monitor the patient’s symptoms °If they are getting sicker, call his or her medical provider and tell them that the patient has, or is being evaluated for, °COVID-19 infection. This will help the   healthcare providers office take steps to keep other people from getting infected. Ask the healthcare provider to call the local or state health department.  Limit the number of people who have contact with the patient If possible, have only one caregiver  for the patient. Other household members should stay in another home or place of residence. If this is not possible, they should stay in another room, or be separated from the patient as much as possible. Use a separate bathroom, if available. Restrict visitors who do not have an essential need to be in the home.  Keep older adults, very young children, and other sick people away from the patient Keep older adults, very young children, and those who have compromised immune systems or chronic health conditions away from the patient. This includes people with chronic heart, lung, or kidney conditions, diabetes, and cancer.  Ensure good ventilation Make sure that shared spaces in the home have good air flow, such as from an air conditioner or an opened window, weather permitting.  Wash your hands often Wash your hands often and thoroughly with soap and water for at least 20 seconds. You can use an alcohol based hand sanitizer if soap and water are not available and if your hands are not visibly dirty. Avoid touching your eyes, nose, and mouth with unwashed hands. Use disposable paper towels to dry your hands. If not available, use dedicated cloth towels and replace them when they become wet.  Wear a facemask and gloves Wear a disposable facemask at all times in the room and gloves when you touch or have contact with the patients blood, body fluids, and/or secretions or excretions, such as sweat, saliva, sputum, nasal mucus, vomit, urine, or feces.  Ensure the mask fits over your nose and mouth tightly, and do not touch it during use. Throw out disposable facemasks and gloves after using them. Do not reuse. Wash your hands immediately after removing your facemask and gloves. If your personal clothing becomes contaminated, carefully remove clothing and launder. Wash your hands after handling contaminated clothing. Place all used disposable facemasks, gloves, and other waste in a lined container  before disposing them with other household waste. Remove gloves and wash your hands immediately after handling these items.  Do not share dishes, glasses, or other household items with the patient Avoid sharing household items. You should not share dishes, drinking glasses, cups, eating utensils, towels, bedding, or other items with a patient who is confirmed to have, or being evaluated for, COVID-19 infection. After the person uses these items, you should wash them thoroughly with soap and water.  Wash laundry thoroughly Immediately remove and wash clothes or bedding that have blood, body fluids, and/or secretions or excretions, such as sweat, saliva, sputum, nasal mucus, vomit, urine, or feces, on them. Wear gloves when handling laundry from the patient. Read and follow directions on labels of laundry or clothing items and detergent. In general, wash and dry with the warmest temperatures recommended on the label.  Clean all areas the individual has used often Clean all touchable surfaces, such as counters, tabletops, doorknobs, bathroom fixtures, toilets, phones, keyboards, tablets, and bedside tables, every day. Also, clean any surfaces that may have blood, body fluids, and/or secretions or excretions on them. Wear gloves when cleaning surfaces the patient has come in contact with. Use a diluted bleach solution (e.g., dilute bleach with 1 part bleach and 10 parts water) or a household disinfectant with a label that says EPA-registered for coronaviruses. To  make a bleach solution at home, add 1 tablespoon of bleach to 1 quart (4 cups) of water. For a larger supply, add  cup of bleach to 1 gallon (16 cups) of water. Read labels of cleaning products and follow recommendations provided on product labels. Labels contain instructions for safe and effective use of the cleaning product including precautions you should take when applying the product, such as wearing gloves or eye protection and making  sure you have good ventilation during use of the product. Remove gloves and wash hands immediately after cleaning.  Monitor yourself for signs and symptoms of illness Caregivers and household members are considered close contacts, should monitor their health, and will be asked to limit movement outside of the home to the extent possible. Follow the monitoring steps for close contacts listed on the symptom monitoring form.   ? If you have additional questions, contact your local health department or call the epidemiologist on call at [phone] (available 24/7). ? This guidance is subject to change. For the most up-to-date guidance from [facility], please refer to their website: [email or web address]

## 2018-08-24 NOTE — ED Provider Notes (Signed)
[facility]-EMERGENCY DEPT Provider Note   CSN: [id number] Arrival date & time: 08/24/18  0700    History   Chief Complaint Chief Complaint  Patient presents with  . Fever  . Cough   Level 5 caveat: Intellectual disability  HPI Hannah Houston is a 48 y.o. female.     HPI 48 year old female with a history of intellectual disability and nonverbal learning disorder presents to the emergency department from a group home with reported cough and fever from the group home.  This is been present over the past 24 hours.   Past Medical History:  Diagnosis Date  . Anemia   . Gastritis   . GERD (gastroesophageal reflux disease)   . Intellectual disability   . Iron deficiency   . Mental retardation   . Non-verbal learning disorder   . Poor posture    walks bent over  . Urine incontinence    occassional, uses depends    Patient Active Problem List   Diagnosis Date Noted  . Encounter for gynecological examination with Papanicolaou smear of cervix 08/06/2016    Past Surgical History:  Procedure Laterality Date  . DENTAL RESTORATION/EXTRACTION WITH X-RAY N/A 05/06/2014   Procedure: CLEANING XRAY, DENTAL RESTORATION AND EXTRACTIONS;  Surgeon: Esaw DaceWilliam E Milner Jr., DDS;  Location: [facility] OR;  Service: Oral Surgery;  Laterality: N/A;  Recall Exam, Full mouth xrays and full mouth debridement,  Glass ionomers - #2 OF; #3 OL; #4 O; #6 F; #12 O; #13 O; #14 OFL; #20 DO Extractions - 12 Prime - #14, 15, 18, 19, 23, 27, 28, 29, 30, 31  . DENTAL RESTORATION/EXTRACTION WITH X-RAY N/A 06/15/2015   Procedure: DENTAL RESTORATION; EXTRACTION OF TEETH NUMBER TWO AND EIGHTEEN WITH X-RAY and cleaning;  Surgeon: Boneta LucksWilliam Milner, DDS;  Location: [facility] OR;  Service: Oral Surgery;  Laterality: N/A;  . NO PAST SURGERIES       OB History   No obstetric history on file.      Home Medications    Prior to Admission medications   Medication Sig Start Date End Date Taking? Authorizing  Provider  acetaminophen (TYLENOL) 325 MG tablet Take 650 mg by mouth every 4 (four) hours as needed for mild pain, moderate pain, fever or headache. And 3 times daily from 9/10-9/13    [provider]  amoxicillin (AMOXIL) 500 MG tablet Take 500 mg by mouth 3 (three) times daily.    [provider]  cholecalciferol (VITAMIN D) 1000 units tablet Take 1,000 Units by mouth daily.    [provider]  divalproex (DEPAKOTE SPRINKLE) 125 MG capsule Take 250 mg by mouth 2 (two) times daily.    [provider]  DULoxetine (CYMBALTA) 60 MG capsule Take 60 mg by mouth daily.    [provider]  guaiFENesin 200 MG tablet Take 600 mg by mouth 2 (two) times daily. From 9/10-9/13    [provider]  LORazepam (ATIVAN) 1 MG tablet Take 1-2 mg by mouth 3 (three) times daily. Takes 1 mg in the morning at 8:00 am, 2 mg in the afternoon at 1400 and 2 mg in the evening 2000 And 1 tablet 30 minutes prior to procedure    [provider]  LORazepam (ATIVAN) 2 MG tablet Take 2 mg by mouth. 30 minutes prior to procedure    [provider]  Melatonin 3 MG CAPS Take 1 capsule by mouth at bedtime.    [provider]  QUEtiapine (SEROQUEL) 25 MG  tablet Take 25-50 mg by mouth 2 (two) times daily. 25 mg in the morning and 50 mg at bedtime    [provider]  senna (SENOKOT) 8.6 MG TABS tablet Take 1 tablet by mouth at bedtime.    [provider]  vitamin B-12 (CYANOCOBALAMIN) 500 MCG tablet Take 500 mcg by mouth daily.    [provider]  Vitamins A & D (VITAMIN A & D) ointment Apply 1 application topically daily. To lower legs and arms daily    [provider]    Family History No family history on file.  Social History Social History   Tobacco Use  . Smoking status: Never Smoker  . Smokeless tobacco: Never Used  Substance Use Topics  . Alcohol use: No  . Drug use: No     Allergies   Codeine    Review of Systems Review of Systems  Unable to perform ROS: Other     Physical Exam Updated Vital Signs BP 106/63 (BP Location: Left Arm)   Pulse 60   Temp 98.6 F (37 C) (Axillary)   Resp 18   Ht 4\' 8"  (1.422 m)   Wt 54.9 kg   SpO2 97%   BMI 27.13 kg/m   Physical Exam Vitals signs and nursing note reviewed.  Constitutional:      General: She is not in acute distress.    Appearance: She is well-developed.  HENT:     Head: Normocephalic and atraumatic.  Neck:     Musculoskeletal: Normal range of motion.  Cardiovascular:     Rate and Rhythm: Normal rate and regular rhythm.     Heart sounds: Normal heart sounds.  Pulmonary:     Effort: Pulmonary effort is normal. No respiratory distress.     Breath sounds: Normal breath sounds. No stridor. No wheezing, rhonchi or rales.  Abdominal:     General: There is no distension.     Palpations: Abdomen is soft.     Tenderness: There is no abdominal tenderness.  Musculoskeletal: Normal range of motion.  Skin:    General: Skin is warm and dry.  Neurological:     General: No focal deficit present.     Mental Status: She is alert. Mental status is at baseline.  Psychiatric:        Judgment: Judgment normal.      ED Treatments / Results  Labs (all labs ordered are listed, but only abnormal results are displayed) Labs Reviewed  SARS CORONAVIRUS 2 (HOSPITAL ORDER, PERFORMED IN [facility]) - Abnormal; Notable for the following components:      Result Value   SARS Coronavirus 2 POSITIVE (*)    All other components within normal limits    EKG None  Radiology No results found.  Procedures Procedures (including critical care time)  Medications Ordered in ED Medications  acetaminophen (TYLENOL) tablet 650 mg (has no administration in time range)     Initial Impression / Assessment and Plan / ED Course  I have reviewed the triage vital signs and the nursing notes.  Pertinent labs & imaging results that  were available during my care of the patient were reviewed by me and considered in my medical decision making (see chart for details).        Given current pandemic patient will be checked for coronavirus given her group home status such that if she is positive she will need appropriate isolation from the other members of the group home.  Chest x-ray  pending.  11:36 AM COVID +  Appropriate home quarantine and infection reducing strategies provided on her discharge paperwork.  No hypoxia.  No increased work of breathing.  Stable for discharge back to her group home  Final Clinical Impressions(s) / ED Diagnoses   Final diagnoses:  Cough    ED Discharge Orders    None       Jola Schmidt, MD 08/24/18 1137

## 2018-08-24 NOTE — ED Notes (Signed)
Spoke with staff at group home, states that the RN is on a Zoom call with corporate at the moment, as they are trying to "come up with a plan" for pt arrival back home.

## 2018-08-24 NOTE — ED Triage Notes (Signed)
Patient brought in by [facility]. Patient came from group home from [address]. Group home stated patient had a fever and a cough. [facility] states patients temp was 97.1.

## 2018-08-24 NOTE — ED Notes (Signed)
Report called to [facility]. group home, transportation will be coming to pick pt up.

## 2018-08-30 ENCOUNTER — Emergency Department (HOSPITAL_COMMUNITY): Payer: Medicare Other

## 2018-08-30 ENCOUNTER — Encounter (HOSPITAL_COMMUNITY): Payer: Self-pay

## 2018-08-30 ENCOUNTER — Other Ambulatory Visit: Payer: Self-pay

## 2018-08-30 ENCOUNTER — Inpatient Hospital Stay (HOSPITAL_COMMUNITY)
Admission: EM | Admit: 2018-08-30 | Discharge: 2018-09-04 | DRG: 177 | Disposition: A | Discharge status: Home / Self Care | Payer: Medicare Other | Attending: Internal Medicine | Admitting: Internal Medicine

## 2018-08-30 DIAGNOSIS — D61818 Other pancytopenia: Secondary | ICD-10-CM | POA: Diagnosis present

## 2018-08-30 DIAGNOSIS — J9601 Acute respiratory failure with hypoxia: Secondary | ICD-10-CM | POA: Diagnosis present

## 2018-08-30 DIAGNOSIS — K219 Gastro-esophageal reflux disease without esophagitis: Secondary | ICD-10-CM | POA: Diagnosis present

## 2018-08-30 DIAGNOSIS — Z781 Physical restraint status: Secondary | ICD-10-CM

## 2018-08-30 DIAGNOSIS — F79 Unspecified intellectual disabilities: Secondary | ICD-10-CM | POA: Diagnosis present

## 2018-08-30 DIAGNOSIS — Z79890 Hormone replacement therapy (postmenopausal): Secondary | ICD-10-CM

## 2018-08-30 DIAGNOSIS — J1289 Other viral pneumonia: Secondary | ICD-10-CM | POA: Diagnosis present

## 2018-08-30 DIAGNOSIS — Z79899 Other long term (current) drug therapy: Secondary | ICD-10-CM

## 2018-08-30 DIAGNOSIS — U071 COVID-19: Secondary | ICD-10-CM | POA: Diagnosis not present

## 2018-08-30 DIAGNOSIS — I959 Hypotension, unspecified: Secondary | ICD-10-CM | POA: Diagnosis present

## 2018-08-30 DIAGNOSIS — Z885 Allergy status to narcotic agent status: Secondary | ICD-10-CM

## 2018-08-30 DIAGNOSIS — F840 Autistic disorder: Secondary | ICD-10-CM | POA: Diagnosis present

## 2018-08-30 LAB — CBC WITH DIFFERENTIAL/PLATELET
Abs Immature Granulocytes: 0.02 10*3/uL (ref 0.00–0.07)
Basophils Absolute: 0 10*3/uL (ref 0.0–0.1)
Basophils Relative: 0 %
Eosinophils Absolute: 0 10*3/uL (ref 0.0–0.5)
Eosinophils Relative: 0 %
HCT: 34 % — ABNORMAL LOW (ref 36.0–46.0)
Hemoglobin: 11.4 g/dL — ABNORMAL LOW (ref 12.0–15.0)
Immature Granulocytes: 1 %
Lymphocytes Relative: 33 %
Lymphs Abs: 1 10*3/uL (ref 0.7–4.0)
MCH: 31.4 pg (ref 26.0–34.0)
MCHC: 33.5 g/dL (ref 30.0–36.0)
MCV: 93.7 fL (ref 80.0–100.0)
Monocytes Absolute: 0.2 10*3/uL (ref 0.1–1.0)
Monocytes Relative: 6 %
Neutro Abs: 1.8 10*3/uL (ref 1.7–7.7)
Neutrophils Relative %: 60 %
Platelets: 113 10*3/uL — ABNORMAL LOW (ref 150–400)
RBC: 3.63 MIL/uL — ABNORMAL LOW (ref 3.87–5.11)
RDW: 12.3 % (ref 11.5–15.5)
WBC: 3 10*3/uL — ABNORMAL LOW (ref 4.0–10.5)
nRBC: 0 % (ref 0.0–0.2)

## 2018-08-30 LAB — COMPREHENSIVE METABOLIC PANEL
ALT: 17 U/L (ref 0–44)
AST: 24 U/L (ref 15–41)
Albumin: 3 g/dL — ABNORMAL LOW (ref 3.5–5.0)
Alkaline Phosphatase: 73 U/L (ref 38–126)
Anion gap: 9 (ref 5–15)
BUN: 19 mg/dL (ref 6–20)
CO2: 27 mmol/L (ref 22–32)
Calcium: 8 mg/dL — ABNORMAL LOW (ref 8.9–10.3)
Chloride: 103 mmol/L (ref 98–111)
Creatinine, Ser: 0.67 mg/dL (ref 0.44–1.00)
GFR calc Af Amer: >60 mL/min (ref >60)
GFR calc non Af Amer: >60 mL/min (ref >60)
Glucose, Bld: 104 mg/dL — ABNORMAL HIGH (ref 70–99)
Potassium: 3.2 mmol/L — ABNORMAL LOW (ref 3.5–5.1)
Sodium: 139 mmol/L (ref 135–145)
Total Bilirubin: 0.1 mg/dL — ABNORMAL LOW (ref 0.3–1.2)
Total Protein: 6.3 g/dL — ABNORMAL LOW (ref 6.5–8.1)

## 2018-08-30 LAB — LACTATE DEHYDROGENASE: LDH: 195 U/L — ABNORMAL HIGH (ref 98–192)

## 2018-08-30 LAB — C-REACTIVE PROTEIN: CRP: 1.6 mg/dL — ABNORMAL HIGH (ref <1.0)

## 2018-08-30 LAB — I-STAT BETA HCG BLOOD, ED (MC, WL, AP ONLY): I-stat hCG, quantitative: <5 m[IU]/mL (ref <5)

## 2018-08-30 LAB — LACTIC ACID, PLASMA: Lactic Acid, Venous: 0.8 mmol/L (ref 0.5–1.9)

## 2018-08-30 MED ORDER — SODIUM CHLORIDE 0.9 % IV BOLUS
1000.0000 mL | Freq: Once | INTRAVENOUS | Status: AC
  Administered 2018-08-30: 1000 mL via INTRAVENOUS

## 2018-08-30 NOTE — ED Triage Notes (Signed)
Per ems: Pt coming from group home from [facility] c/o increased weakness and dec PO intake since Monday. Pt is COVID-19 positive. 600mg  tylenol and robitussin given by staff at 1830. Pt has autism and ripped out IV from ems. Pt was hypotensive upon arrival   400 mL of NS given prior to iv being ripped out

## 2018-08-30 NOTE — ED Notes (Signed)
Bed: SE83 Expected date:  Expected time:  Means of arrival:  Comments: 48 yo F/weakness Fever hypotensive

## 2018-08-30 NOTE — ED Provider Notes (Signed)
[facility]-EMERGENCY DEPT Provider Note   CSN: [id number] Arrival date & time: 08/30/18  2213     History   Chief Complaint Chief Complaint  Patient presents with  . Weakness  . covid +    HPI Hannah Houston is a 48 y.o. female.     The history is provided by the EMS personnel and medical records.     Level 5 caveat: Mental retardation, autism, nonverbal  48 year old female with history of anemia, GERD, mental retardation, nonverbal learning disorder, intellectual disability, presenting to the ED with generalized weakness and lethargy.  Patient cannot contribute anything to history and limited information is known.  Patient resides at a group home and tested positive for COVID-19 on 08/24/2018 at this facility.  Reportedly she has had a decline over the past few days and has been refusing to drink fluids or eat.  She was given 400 cc of normal saline with EMS but then ripped out her IV.  Patient hypotensive on arrival at 75/51.  12:13 AM Spoke with caregiver, Buffy Antonieta PertGaskin [phone], states patient has been in a steady downward trend since her diagnosis but more so over the last 24 hours.  She states she has not shown any interest in eating/drinking even when given her favorite foods.  At baseline, patient is non-verbal but does express her wants/desires through other means.  She is very active, walks around, etc.  Siblings make her medical decisions regarding care, however no prior discussions regarding code status have been had to her knowledge.  Past Medical History:  Diagnosis Date  . Anemia   . Gastritis   . GERD (gastroesophageal reflux disease)   . Intellectual disability   . Iron deficiency   . Mental retardation   . Non-verbal learning disorder   . Poor posture    walks bent over  . Urine incontinence    occassional, uses depends    Patient Active Problem List   Diagnosis Date Noted  . Encounter for gynecological examination with  Papanicolaou smear of cervix 08/06/2016    Past Surgical History:  Procedure Laterality Date  . DENTAL RESTORATION/EXTRACTION WITH X-RAY N/A 05/06/2014   Procedure: CLEANING XRAY, DENTAL RESTORATION AND EXTRACTIONS;  Surgeon: Esaw DaceWilliam E Milner Jr., DDS;  Location: [facility] OR;  Service: Oral Surgery;  Laterality: N/A;  Recall Exam, Full mouth xrays and full mouth debridement,  Glass ionomers - #2 OF; #3 OL; #4 O; #6 F; #12 O; #13 O; #14 OFL; #20 DO Extractions - 12 Prime - #14, 15, 18, 19, 23, 27, 28, 29, 30, 31  . DENTAL RESTORATION/EXTRACTION WITH X-RAY N/A 06/15/2015   Procedure: DENTAL RESTORATION; EXTRACTION OF TEETH NUMBER TWO AND EIGHTEEN WITH X-RAY and cleaning;  Surgeon: Boneta LucksWilliam Milner, DDS;  Location: [facility] OR;  Service: Oral Surgery;  Laterality: N/A;  . NO PAST SURGERIES       OB History   No obstetric history on file.      Home Medications    Prior to Admission medications   Medication Sig Start Date End Date Taking? Authorizing Provider  acetaminophen (TYLENOL) 325 MG tablet Take 650 mg by mouth every 4 (four) hours as needed for mild pain, moderate pain, fever or headache. And 3 times daily from 9/10-9/13    [provider]  amoxicillin (AMOXIL) 500 MG tablet Take 500 mg by mouth 3 (three) times daily.    [provider]  cholecalciferol (VITAMIN D) 1000 units tablet Take 1,000 Units by mouth daily.  [provider]  divalproex (DEPAKOTE SPRINKLE) 125 MG capsule Take 250 mg by mouth 2 (two) times daily.    [provider]  DULoxetine (CYMBALTA) 60 MG capsule Take 60 mg by mouth daily.    [provider]  guaiFENesin 200 MG tablet Take 600 mg by mouth 2 (two) times daily. From 9/10-9/13    [provider]  LORazepam (ATIVAN) 1 MG tablet Take 1-2 mg by mouth 3 (three) times daily. Takes 1 mg in the morning at 8:00 am, 2 mg in the afternoon at 1400 and 2 mg in the evening 2000 And 1 tablet 30 minutes prior to procedure     [provider]  LORazepam (ATIVAN) 2 MG tablet Take 2 mg by mouth. 30 minutes prior to procedure    [provider]  Melatonin 3 MG CAPS Take 1 capsule by mouth at bedtime.    [provider]  QUEtiapine (SEROQUEL) 25 MG tablet Take 25-50 mg by mouth 2 (two) times daily. 25 mg in the morning and 50 mg at bedtime    [provider]  senna (SENOKOT) 8.6 MG TABS tablet Take 1 tablet by mouth at bedtime.    [provider]  vitamin B-12 (CYANOCOBALAMIN) 500 MCG tablet Take 500 mcg by mouth daily.    [provider]  Vitamins A & D (VITAMIN A & D) ointment Apply 1 application topically daily. To lower legs and arms daily    [provider]    Family History No family history on file.  Social History Social History   Tobacco Use  . Smoking status: Never Smoker  . Smokeless tobacco: Never Used  Substance Use Topics  . Alcohol use: No  . Drug use: No     Allergies   Codeine   Review of Systems Review of Systems  Unable to perform ROS: Patient nonverbal     Physical Exam Updated Vital Signs BP (!) 83/54 (BP Location: Left Arm)   Pulse 66   Temp (!) 97.4 F (36.3 C) (Axillary)   Resp 16   SpO2 96%   Physical Exam Vitals signs and nursing note reviewed.  Constitutional:      Appearance: She is well-developed. She is ill-appearing.  HENT:     Head: Normocephalic and atraumatic.     Mouth/Throat:     Comments: Dry mucous membranes, poor dentition, multiple teeth are missing Eyes:     Conjunctiva/sclera: Conjunctivae normal.     Pupils: Pupils are equal, round, and reactive to light.  Neck:     Musculoskeletal: Normal range of motion.  Cardiovascular:     Rate and Rhythm: Normal rate and regular rhythm.     Heart sounds: Normal heart sounds.  Pulmonary:     Effort: Pulmonary effort is normal. No respiratory distress.     Breath sounds: Normal breath sounds. No stridor.  Abdominal:     General: Bowel  sounds are normal.     Palpations: Abdomen is soft.  Musculoskeletal: Normal range of motion.  Skin:    General: Skin is warm and dry.  Neurological:     Mental Status: She is lethargic.     Comments: Lethargic, non-verbal at baseline, withdraws from pain during IV start, limited movement otherwise      ED Treatments / Results  Labs (all labs ordered are listed, but only abnormal results are displayed) Labs Reviewed  CBC WITH DIFFERENTIAL/PLATELET - Abnormal; Notable for the following components:      Result  Value   WBC 3.0 (*)    RBC 3.63 (*)    Hemoglobin 11.4 (*)    HCT 34.0 (*)    Platelets 113 (*)    All other components within normal limits  COMPREHENSIVE METABOLIC PANEL - Abnormal; Notable for the following components:   Potassium 3.2 (*)    Glucose, Bld 104 (*)    Calcium 8.0 (*)    Total Protein 6.3 (*)    Albumin 3.0 (*)    Total Bilirubin 0.1 (*)    All other components within normal limits  D-DIMER, QUANTITATIVE (NOT AT [facility]) - Abnormal; Notable for the following components:   D-Dimer, Quant 0.51 (*)    All other components within normal limits  LACTATE DEHYDROGENASE - Abnormal; Notable for the following components:   LDH 195 (*)    All other components within normal limits  FIBRINOGEN - Abnormal; Notable for the following components:   Fibrinogen 479 (*)    All other components within normal limits  C-REACTIVE PROTEIN - Abnormal; Notable for the following components:   CRP 1.6 (*)    All other components within normal limits  CULTURE, BLOOD (ROUTINE X 2)  CULTURE, BLOOD (ROUTINE X 2)  LACTIC ACID, PLASMA  PROCALCITONIN  FERRITIN  TRIGLYCERIDES  I-STAT BETA HCG BLOOD, ED ([facility], [facility], [facility] ONLY)    EKG    Radiology Dg Chest Port 1 View  Result Date: 08/30/2018 CLINICAL DATA:  Hypotensive. Lethargy. COVID positive. EXAM: PORTABLE CHEST 1 VIEW COMPARISON:  None. FINDINGS: Patchy bibasilar airspace disease, right greater than left. Lesser patchy opacities  in the right mid and upper lung zones. Lung volumes are low. Upper normal heart size likely accentuated by technique and low lung volumes. No evidence of pleural effusion or pneumothorax. Remote midshaft right clavicle fracture. IMPRESSION: Multifocal bibasilar predominant airspace disease consistent with atypical viral pneumonia in this patient with known COVID infection. Electronically Signed   By: Keith Rake M.D.   On: 08/30/2018 23:45    Procedures Procedures (including critical care time)  CRITICAL CARE Performed by: Larene Pickett   Total critical care time: 35 minutes  Critical care time was exclusive of separately billable procedures and treating other patients.  Critical care was necessary to treat or prevent imminent or life-threatening deterioration.  Critical care was time spent personally by me on the following activities: development of treatment plan with patient and/or surrogate as well as nursing, discussions with consultants, evaluation of patient's response to treatment, examination of patient, obtaining history from patient or surrogate, ordering and performing treatments and interventions, ordering and review of laboratory studies, ordering and review of radiographic studies, pulse oximetry and re-evaluation of patient's condition.   Medications Ordered in ED Medications  sodium chloride 0.9 % bolus 1,000 mL (0 mLs Intravenous Stopped 08/31/18 0104)  sodium chloride 0.9 % bolus 1,000 mL (0 mLs Intravenous Stopped 08/31/18 0243)     Initial Impression / Assessment and Plan / ED Course  I have reviewed the triage vital signs and the nursing notes.  Pertinent labs & imaging results that were available during my care of the patient were reviewed by me and considered in my medical decision making (see chart for details).  48 year old female here with weakness.  She is autistic and nonverbal at baseline but after talking with caregiver it seems she has had a decline  since her diagnosis of COVID-19 on 08/24/2018.  She has not eaten or taken any fluids in the past 2 days.  Patient is hypotensive on arrival here and does appear lethargic.  She appears clinically dehydrated with very dry mucous membranes.  She does withdraw from pain appropriately with IV start.  She is protecting her airway and vitals are otherwise stable on room air.  Suspect decompensation due to her intellectual disabilities and inability to adhere with at home recommendations.  We will proceed with sepsis work-up.  She was given IV fluids.  I did speak with patient's personal caregiver, Buffy, to her knowledge family has not had any discussions about her code status as she has not had any major medical issues up to this point in her life.  Patient's work-up as above.  Chest x-ray does reveal multifocal bibasilar airspace disease consistent with COVID.  Her blood pressure has continued trending upwards here with IV fluids and she is becoming more responsive, now sitting up in the bed and looking around.  Her oxygen remained stable on room air.  I do feel she would benefit from admission for ongoing care until she at least returns to her baseline.  Caregiver is agreeable and has been in contact with family.  Discussed with Dr. Toniann FailKakrakandy- will [facility] to [facility] care.  Final Clinical Impressions(s) / ED Diagnoses   Final diagnoses:  COVID-19    ED Discharge Orders    None       Garlon HatchetSanders, Niyam Bisping M, PA-C 08/31/18 0340    Maia PlanLong, Joshua G, MD 09/01/18 [phone]

## 2018-08-30 NOTE — ED Notes (Signed)
Lattie Haw, [place] made aware of bp

## 2018-08-30 NOTE — ED Notes (Signed)
Buffy Rosette Reveal [phone] would like to be contacted with updates regarding pt.

## 2018-08-31 ENCOUNTER — Encounter (HOSPITAL_COMMUNITY): Payer: Self-pay | Admitting: Internal Medicine

## 2018-08-31 DIAGNOSIS — Z79899 Other long term (current) drug therapy: Secondary | ICD-10-CM | POA: Diagnosis not present

## 2018-08-31 DIAGNOSIS — J1289 Other viral pneumonia: Secondary | ICD-10-CM | POA: Diagnosis not present

## 2018-08-31 DIAGNOSIS — F840 Autistic disorder: Secondary | ICD-10-CM | POA: Diagnosis present

## 2018-08-31 DIAGNOSIS — J9601 Acute respiratory failure with hypoxia: Secondary | ICD-10-CM | POA: Diagnosis not present

## 2018-08-31 DIAGNOSIS — I959 Hypotension, unspecified: Secondary | ICD-10-CM | POA: Diagnosis not present

## 2018-08-31 DIAGNOSIS — Z79890 Hormone replacement therapy (postmenopausal): Secondary | ICD-10-CM | POA: Diagnosis not present

## 2018-08-31 DIAGNOSIS — Z885 Allergy status to narcotic agent status: Secondary | ICD-10-CM | POA: Diagnosis not present

## 2018-08-31 DIAGNOSIS — Z781 Physical restraint status: Secondary | ICD-10-CM | POA: Diagnosis not present

## 2018-08-31 DIAGNOSIS — K219 Gastro-esophageal reflux disease without esophagitis: Secondary | ICD-10-CM | POA: Diagnosis not present

## 2018-08-31 DIAGNOSIS — U071 COVID-19: Secondary | ICD-10-CM | POA: Diagnosis not present

## 2018-08-31 DIAGNOSIS — D61818 Other pancytopenia: Secondary | ICD-10-CM | POA: Diagnosis not present

## 2018-08-31 DIAGNOSIS — J988 Other specified respiratory disorders: Secondary | ICD-10-CM | POA: Diagnosis not present

## 2018-08-31 DIAGNOSIS — F79 Unspecified intellectual disabilities: Secondary | ICD-10-CM | POA: Diagnosis not present

## 2018-08-31 LAB — D-DIMER, QUANTITATIVE: D-Dimer, Quant: 0.51 ug/mL-FEU — ABNORMAL HIGH (ref 0.00–0.50)

## 2018-08-31 LAB — LACTATE DEHYDROGENASE: LDH: 213 U/L — ABNORMAL HIGH (ref 98–192)

## 2018-08-31 LAB — VALPROIC ACID LEVEL: Valproic Acid Lvl: 57 ug/mL (ref 50.0–100.0)

## 2018-08-31 LAB — TRIGLYCERIDES: Triglycerides: 64 mg/dL (ref <150)

## 2018-08-31 LAB — AMMONIA: Ammonia: 32 umol/L (ref 9–35)

## 2018-08-31 LAB — PROCALCITONIN
Procalcitonin: <0.1 ng/mL
Procalcitonin: <0.1 ng/mL

## 2018-08-31 LAB — LACTIC ACID, PLASMA: Lactic Acid, Venous: 0.8 mmol/L (ref 0.5–1.9)

## 2018-08-31 LAB — HEPATIC FUNCTION PANEL
ALT: 14 U/L (ref 0–44)
AST: 23 U/L (ref 15–41)
Albumin: 2.9 g/dL — ABNORMAL LOW (ref 3.5–5.0)
Alkaline Phosphatase: 68 U/L (ref 38–126)
Bilirubin, Direct: 0.1 mg/dL (ref 0.0–0.2)
Indirect Bilirubin: 0.4 mg/dL (ref 0.3–0.9)
Total Bilirubin: 0.5 mg/dL (ref 0.3–1.2)
Total Protein: 6.1 g/dL — ABNORMAL LOW (ref 6.5–8.1)

## 2018-08-31 LAB — FERRITIN
Ferritin: 39 ng/mL (ref 11–307)
Ferritin: 39 ng/mL (ref 11–307)

## 2018-08-31 LAB — GLUCOSE, CAPILLARY
Glucose-Capillary: 71 mg/dL (ref 70–99)
Glucose-Capillary: 81 mg/dL (ref 70–99)
Glucose-Capillary: 112 mg/dL — ABNORMAL HIGH (ref 70–99)

## 2018-08-31 LAB — TROPONIN I: Troponin I: <0.03 ng/mL (ref <0.03)

## 2018-08-31 LAB — ABO/RH: ABO/RH(D): A POS

## 2018-08-31 LAB — C-REACTIVE PROTEIN: CRP: 1.4 mg/dL — ABNORMAL HIGH (ref <1.0)

## 2018-08-31 LAB — FIBRINOGEN: Fibrinogen: 479 mg/dL — ABNORMAL HIGH (ref 210–475)

## 2018-08-31 MED ORDER — ACETAMINOPHEN 325 MG PO TABS: 650.0000 mg | ORAL_TABLET | Freq: Four times a day (QID) | ORAL | Status: DC | PRN

## 2018-08-31 MED ORDER — VALPROATE SODIUM 500 MG/5ML IV SOLN
250.0000 mg | Freq: Two times a day (BID) | INTRAVENOUS | Status: DC
  Administered 2018-08-31 – 2018-09-04 (×9): 250 mg via INTRAVENOUS
  Filled 2018-08-31 (×10): qty 2.5

## 2018-08-31 MED ORDER — SODIUM CHLORIDE 0.9 % IV SOLN
200.0000 mg | Freq: Once | INTRAVENOUS | Status: AC
  Administered 2018-08-31: 200 mg via INTRAVENOUS
  Filled 2018-08-31: qty 40

## 2018-08-31 MED ORDER — ENOXAPARIN SODIUM 40 MG/0.4ML ~~LOC~~ SOLN
40.0000 mg | SUBCUTANEOUS | Status: DC
  Administered 2018-08-31 – 2018-09-04 (×5): 40 mg via SUBCUTANEOUS
  Filled 2018-08-31 (×4): qty 0.4

## 2018-08-31 MED ORDER — ONDANSETRON HCL 4 MG/2ML IJ SOLN: 4.0000 mg | Freq: Four times a day (QID) | INTRAMUSCULAR | Status: DC | PRN

## 2018-08-31 MED ORDER — METHYLPREDNISOLONE SODIUM SUCC 40 MG IJ SOLR
30.0000 mg | Freq: Two times a day (BID) | INTRAMUSCULAR | Status: DC
  Administered 2018-08-31 – 2018-09-03 (×7): 30 mg via INTRAVENOUS
  Filled 2018-08-31 (×7): qty 1

## 2018-08-31 MED ORDER — LORAZEPAM 2 MG/ML IJ SOLN
0.5000 mg | INTRAMUSCULAR | Status: DC | PRN
  Administered 2018-09-02: 0.5 mg via INTRAVENOUS
  Filled 2018-08-31: qty 1

## 2018-08-31 MED ORDER — POTASSIUM CHLORIDE CRYS ER 20 MEQ PO TBCR
30.0000 meq | EXTENDED_RELEASE_TABLET | ORAL | Status: AC
  Administered 2018-08-31: 30 meq via ORAL
  Filled 2018-08-31: qty 2

## 2018-08-31 MED ORDER — ACETAMINOPHEN 650 MG RE SUPP: 650.0000 mg | Freq: Four times a day (QID) | RECTAL | Status: DC | PRN

## 2018-08-31 MED ORDER — ONDANSETRON HCL 4 MG PO TABS: 4.0000 mg | ORAL_TABLET | Freq: Four times a day (QID) | ORAL | Status: DC | PRN

## 2018-08-31 MED ORDER — SODIUM CHLORIDE 0.9 % IV SOLN
INTRAVENOUS | Status: AC
  Administered 2018-08-31: 13:00:00 via INTRAVENOUS

## 2018-08-31 MED ORDER — QUETIAPINE FUMARATE 25 MG PO TABS
25.0000 mg | ORAL_TABLET | Freq: Two times a day (BID) | ORAL | Status: DC
  Administered 2018-08-31 – 2018-09-03 (×7): 25 mg via ORAL
  Filled 2018-08-31 (×12): qty 1

## 2018-08-31 MED ORDER — SODIUM CHLORIDE 0.9 % IV BOLUS
1000.0000 mL | Freq: Once | INTRAVENOUS | Status: AC
  Administered 2018-08-31: 1000 mL via INTRAVENOUS

## 2018-08-31 MED ORDER — SODIUM CHLORIDE 0.9 % IV SOLN
100.0000 mg | INTRAVENOUS | Status: AC
  Administered 2018-09-01 – 2018-09-04 (×4): 100 mg via INTRAVENOUS
  Filled 2018-08-31 (×4): qty 20

## 2018-08-31 NOTE — Progress Notes (Signed)
Pt admitted to 125 from [facility]. Pt autistic and nonverbal. Very resistant to care devices such as nasal cannula, BP cuff, tele wires. Constantly trying to and successfully removing these items even with mittens on and O2 taped to face. Paged MD and received order for 1:1 sitter for safety. Continue to monitor. Hortencia Conradi RN

## 2018-08-31 NOTE — ED Notes (Signed)
ED TO INPATIENT HANDOFF REPORT  Name/Age/Gender Hannah Houston 48 y.o. female  Code Status   Home/SNF/Other Home  Chief Complaint covid +; weakness  Level of Care/Admitting Diagnosis ED Disposition    ED Disposition Condition Comment   Admit  [facility] [[id number]]  Level of Care: Progressive [102]  Covid Evaluation: Confirmed COVID Positive  Isolation Risk Level: Low Risk/Droplet (Less than 4L [place] supplementation)  Diagnosis: Pneumonia due to COVID-19 virus [[id number]][[id number]]  Admitting Physician: Eduard ClosKAKRAKANDY, ARSHAD N [phone][[id number]]  Attending Physician: Eduard ClosKAKRAKANDY, ARSHAD N [phone][[id number]]  Estimated length of stay: past midnight tomorrow  Certification:: I certify this patient will need inpatient services for at least 2 midnights  PT Class (Do Not Modify): Inpatient [101]  PT Acc Code (Do Not Modify): Private [1]       Medical History Past Medical History:  Diagnosis Date  . Anemia   . Gastritis   . GERD (gastroesophageal reflux disease)   . Intellectual disability   . Iron deficiency   . Mental retardation   . Non-verbal learning disorder   . Poor posture    walks bent over  . Urine incontinence    occassional, uses depends    Allergies Allergies  Allergen Reactions  . Codeine Other (See Comments)    Unknown    IV Location/Drains/Wounds Patient Lines/Drains/Airways Status   Active Line/Drains/Airways    Name:   Placement date:   Placement time:   Site:   Days:   Peripheral IV 08/30/18 Right Antecubital   08/30/18    2306    Antecubital   1   Peripheral IV 08/30/18 Left Hand   08/30/18    2321    Hand   1   Incision (Closed) 05/06/14 Lip Other (Comment)   05/06/14    1253     1578   Incision (Closed) 06/15/15 Lip Other (Comment)   06/15/15    0750     1173          Labs/Imaging Results for orders placed or performed during the hospital encounter of 08/30/18 (from the past 48 hour(s))  Lactic acid, plasma     Status: None   Collection Time: 08/30/18 10:57 PM  Result Value Ref Range   Lactic Acid, Venous 0.8 0.5 - 1.9 mmol/L    Comment: Performed at [facility], [address] [place], [place] [address]  CBC WITH DIFFERENTIAL     Status: Abnormal   Collection Time: 08/30/18 10:57 PM  Result Value Ref Range   WBC 3.0 (L) 4.0 - 10.5 K/uL   RBC 3.63 (L) 3.87 - 5.11 MIL/uL   Hemoglobin 11.4 (L) 12.0 - 15.0 g/dL   HCT 62.134.0 (L) 30.836.0 - 65.746.0 %   MCV 93.7 80.0 - 100.0 fL   MCH 31.4 26.0 - 34.0 pg   MCHC 33.5 30.0 - 36.0 g/dL   RDW 84.612.3 96.211.5 - 95.215.5 %   Platelets 113 (L) 150 - 400 K/uL    Comment: REPEATED TO VERIFY PLATELET COUNT CONFIRMED BY SMEAR SPECIMEN CHECKED FOR CLOTS Immature Platelet Fraction may be clinically indicated, consider ordering this additional test [id number]    nRBC 0.0 0.0 - 0.2 %   Neutrophils Relative % 60 %   Neutro Abs 1.8 1.7 - 7.7 K/uL   Lymphocytes Relative 33 %   Lymphs Abs 1.0 0.7 - 4.0 K/uL   Monocytes Relative 6 %   Monocytes Absolute 0.2 0.1 - 1.0 K/uL   Eosinophils Relative  0 %   Eosinophils Absolute 0.0 0.0 - 0.5 K/uL   Basophils Relative 0 %   Basophils Absolute 0.0 0.0 - 0.1 K/uL   Immature Granulocytes 1 %   Abs Immature Granulocytes 0.02 0.00 - 0.07 K/uL    Comment: Performed at [facility], [address] [place], [place] [address]  Comprehensive metabolic panel     Status: Abnormal   Collection Time: 08/30/18 10:57 PM  Result Value Ref Range   Sodium 139 135 - 145 mmol/L   Potassium 3.2 (L) 3.5 - 5.1 mmol/L   Chloride 103 98 - 111 mmol/L   CO2 27 22 - 32 mmol/L   Glucose, Bld 104 (H) 70 - 99 mg/dL   BUN 19 6 - 20 mg/dL   Creatinine, Ser 1.020.67 0.44 - 1.00 mg/dL   Calcium 8.0 (L) 8.9 - 10.3 mg/dL   Total Protein 6.3 (L) 6.5 - 8.1 g/dL   Albumin 3.0 (L) 3.5 - 5.0 g/dL   AST 24 15 - 41 U/L   ALT 17 0 - 44 U/L   Alkaline Phosphatase 73 38 - 126 U/L   Total Bilirubin 0.1 (L) 0.3 - 1.2 mg/dL   GFR calc non Af Amer >60  >60 mL/min   GFR calc Af Amer >60 >60 mL/min   Anion gap 9 5 - 15    Comment: Performed at [facility], [address] [place], [place] [address]  D-dimer, quantitative     Status: Abnormal   Collection Time: 08/30/18 10:57 PM  Result Value Ref Range   D-Dimer, Quant 0.51 (H) 0.00 - 0.50 ug/mL-FEU    Comment: (NOTE) At the manufacturer cut-off of 0.50 ug/mL FEU, this assay has been documented to exclude PE with a sensitivity and negative predictive value of 97 to 99%.  At this time, this assay has not been approved by the FDA to exclude DVT/VTE. Results should be correlated with clinical presentation. Performed at [facility], [address] [place], [place] [address]   Procalcitonin     Status: None   Collection Time: 08/30/18 10:57 PM  Result Value Ref Range   Procalcitonin <0.10 ng/mL    Comment:        Interpretation: PCT (Procalcitonin) <= 0.5 ng/mL: Systemic infection (sepsis) is not likely. Local bacterial infection is possible. (NOTE)       Sepsis PCT Algorithm           Lower Respiratory Tract                                      Infection PCT Algorithm    ----------------------------     ----------------------------         PCT < 0.25 ng/mL                PCT < 0.10 ng/mL         Strongly encourage             Strongly discourage   discontinuation of antibiotics    initiation of antibiotics    ----------------------------     -----------------------------       PCT 0.25 - 0.50 ng/mL            PCT 0.10 - 0.25 ng/mL               OR       >80% decrease in  PCT            Discourage initiation of                                            antibiotics      Encourage discontinuation           of antibiotics    ----------------------------     -----------------------------         PCT >= 0.50 ng/mL              PCT 0.26 - 0.50 ng/mL               AND        <80% decrease in PCT             Encourage initiation of                                              antibiotics       Encourage continuation           of antibiotics    ----------------------------     -----------------------------        PCT >= 0.50 ng/mL                  PCT > 0.50 ng/mL               AND         increase in PCT                  Strongly encourage                                      initiation of antibiotics    Strongly encourage escalation           of antibiotics                                     -----------------------------                                           PCT <= 0.25 ng/mL                                                 OR                                        > 80% decrease in PCT                                     Discontinue / Do not initiate  antibiotics Performed at [facility], [place] [address] [place], [place] [address]   Lactate dehydrogenase     Status: Abnormal   Collection Time: 08/30/18 10:57 PM  Result Value Ref Range   LDH 195 (H) 98 - 192 U/L    Comment: Performed at [facility], [place] [employer] [address] [place], [place] [address]  Ferritin     Status: None   Collection Time: 08/30/18 10:57 PM  Result Value Ref Range   Ferritin 39 11 - 307 ng/mL    Comment: Performed at [facility], [place] [address] [place], [place] [address]  Triglycerides     Status: None   Collection Time: 08/30/18 10:57 PM  Result Value Ref Range   Triglycerides 64 <150 mg/dL    Comment: Performed at [facility], [place] [address] [place], [place] [address]  Fibrinogen     Status: Abnormal   Collection Time: 08/30/18 10:57 PM  Result Value Ref Range   Fibrinogen 479 (H) 210 - 475 mg/dL    Comment: Performed at [facility], [place] [address], [place], [place] [address]  C-reactive protein     Status: Abnormal   Collection Time: 08/30/18 10:57 PM  Result Value Ref Range   CRP 1.6 (H) <1.0 mg/dL     Comment: Performed at [facility], [place] [address] [place], [place] [address]  I-Stat beta hCG blood, ED     Status: None   Collection Time: 08/30/18 11:12 PM  Result Value Ref Range   I-stat hCG, quantitative <5.0 <5 mIU/mL   Comment 3            Comment:   GEST. AGE      CONC.  (mIU/mL)   <=1 WEEK        5 - 50     2 WEEKS       50 - 500     3 WEEKS       100 - 10,000     4 WEEKS     1,000 - 30,000        FEMALE AND NON-PREGNANT FEMALE:     LESS THAN 5 mIU/mL    Dg Chest Port 1 View  Result Date: 08/30/2018 CLINICAL DATA:  Hypotensive. Lethargy. COVID positive. EXAM: PORTABLE CHEST 1 VIEW COMPARISON:  None. FINDINGS: Patchy bibasilar airspace disease, right greater than left. Lesser patchy opacities in the right mid and upper lung zones. Lung volumes are low. Upper normal heart size likely accentuated by technique and low lung volumes. No evidence of pleural effusion or pneumothorax. Remote midshaft right clavicle fracture. IMPRESSION: Multifocal bibasilar predominant airspace disease consistent with atypical viral pneumonia in this patient with known COVID infection. Electronically Signed   By: Keith Rake M.D.   On: 08/30/2018 23:45    Pending Labs Unresulted Labs (From admission, onward)    Start     Ordered   08/30/18 2257  Blood Culture (routine x 2)  BLOOD CULTURE X 2,   STAT     08/30/18 2257   Signed and Held  HIV antibody (Routine Testing)  Once,   R     Signed and Held   Signed and Held  CBC  (enoxaparin (LOVENOX)    CrCl >/= 30 ml/min)  Once,   R    Comments: Baseline for enoxaparin therapy IF NOT ALREADY DRAWN.  Notify MD if PLT < 100 K.    Signed and Held  Signed and Held  Creatinine, serum  (enoxaparin (LOVENOX)    CrCl >/= 30 ml/min)  Once,   R    Comments: Baseline for enoxaparin therapy IF NOT ALREADY DRAWN.    Signed and Held   Signed and Held  Creatinine, serum  (enoxaparin (LOVENOX)    CrCl >/= 30 ml/min)  Weekly,   R    Comments:  while on enoxaparin therapy    Signed and Held   Signed and Held  Hepatic function panel  Once,   R     Signed and Held   Medical illustrator and Held  Basic metabolic panel  Once,   R     Signed and Held   Signed and Held  CBC WITH DIFFERENTIAL  Once,   R     Signed and Held   Signed and Held  Lactic acid, plasma  STAT Now then every 3 hours,   R     Signed and Held   Medical illustrator and Held  Valproic acid level  Once,   R     Signed and Held   Signed and Held  Ammonia  Once,   R     Signed and Held   Signed and Held  Valproic acid level  ONCE - STAT,   R     Signed and Held   Signed and Held  ABO/Rh  Once,   R     Signed and Held   Signed and Held  C-reactive protein  Once,   R     Signed and Held   Signed and Held  Ferritin  Once,   R     Signed and Held   Signed and Held  Lactate dehydrogenase  Once,   R     Signed and Held   Signed and Held  Procalcitonin  Once,   R     Signed and Held   Signed and Held  Troponin I - Once  Once,   R     Signed and Held          Vitals/Pain Today's Vitals   08/31/18 0100 08/31/18 0130 08/31/18 0230 08/31/18 0300  BP: (!) 85/57 (!) 87/64 (!) 94/57 90/66  Pulse: 60 71 72 (!) 59  Resp: 18 16 (!) 21 (!) 22  Temp:      TempSrc:      SpO2: 97% 96% 97% 95%    Isolation Precautions No active isolations  Medications Medications  sodium chloride 0.9 % bolus 1,000 mL (0 mLs Intravenous Stopped 08/31/18 0104)  sodium chloride 0.9 % bolus 1,000 mL (0 mLs Intravenous Stopped 08/31/18 0243)    Mobility walks with person assist

## 2018-08-31 NOTE — ED Notes (Signed)
Provider at bedside

## 2018-08-31 NOTE — H&P (Signed)
History and Physical    Hannah Houston D Maiorino ZOX:[id number] DOB: 12/13/1970 DOA: 08/30/2018  PCP: Lucretia Fieldoyals, [place] M  Patient coming from: Group home.  Chief Complaint: Increasing fever and lethargy.  History obtained from patient's caregiver Ms. Buffy Gaskin at [phone].  Also discussed with patient's brother Lorretta Harpric Dils.  HPI: Hannah Houston D Sofranko is a 48 y.o. female with history of autism was brought to the ER with fever and cough on August 24, 2018 at that time patient was diagnosed with COVID-19 and was discharged back to group home.  Since then as per the caregiver patient has become more lethargic and has been having poor appetite.  Patient has become more febrile.  Given the worsening symptoms patient was [facility].  ED Course: On exam patient appears mildly lethargic but easily arousable.  Does not follow commands.  Had a fever of 99.3 in the ER blood pressure initially was in the systolic 80s for which patient was given 2 L fluid bolus.  Labs show potassium of 3.2 creatinine 1.6 WBC count of 3 hemoglobin 11.4 platelets 113 CRP 1.6 procalcitonin less than 0.1 and chest x-ray shows multifocal basilar pneumonia consistent with COVID-19.  D-dimer was 0.5 and fibrinogen was 479.  Review of Systems: As per HPI, rest all negative.   Past Medical History:  Diagnosis Date  . Anemia   . Gastritis   . GERD (gastroesophageal reflux disease)   . Intellectual disability   . Iron deficiency   . Mental retardation   . Non-verbal learning disorder   . Poor posture    walks bent over  . Urine incontinence    occassional, uses depends    Past Surgical History:  Procedure Laterality Date  . DENTAL RESTORATION/EXTRACTION WITH X-RAY N/A 05/06/2014   Procedure: CLEANING XRAY, DENTAL RESTORATION AND EXTRACTIONS;  Surgeon: Esaw DaceWilliam E Milner Jr., DDS;  Location: [facility] OR;  Service: Oral Surgery;  Laterality: N/A;  Recall Exam, Full mouth xrays and full mouth debridement,  Glass  ionomers - #2 OF; #3 OL; #4 O; #6 F; #12 O; #13 O; #14 OFL; #20 DO Extractions - 12 Prime - #14, 15, 18, 19, 23, 27, 28, 29, 30, 31  . DENTAL RESTORATION/EXTRACTION WITH X-RAY N/A 06/15/2015   Procedure: DENTAL RESTORATION; EXTRACTION OF TEETH NUMBER TWO AND EIGHTEEN WITH X-RAY and cleaning;  Surgeon: Boneta LucksWilliam Milner, DDS;  Location: [facility] OR;  Service: Oral Surgery;  Laterality: N/A;  . NO PAST SURGERIES       reports that she has never smoked. She has never used smokeless tobacco. She reports that she does not drink alcohol or use drugs.  Allergies  Allergen Reactions  . Codeine Other (See Comments)    Unknown    Family History  Family history unknown: Yes    Prior to Admission medications   Medication Sig Start Date End Date Taking? Authorizing Provider  acetaminophen (TYLENOL) 325 MG tablet Take 650 mg by mouth every 4 (four) hours as needed for mild pain, moderate pain, fever or headache. And 3 times daily from 9/10-9/13   Yes [provider]  cholecalciferol (VITAMIN D) 1000 units tablet Take 1,000 Units by mouth daily.   Yes [provider]  divalproex (DEPAKOTE SPRINKLE) 125 MG capsule Take 250 mg by mouth 2 (two) times daily.   Yes [provider]  DULoxetine (CYMBALTA) 60 MG capsule Take 60 mg by mouth daily.   Yes [provider]  LORazepam (ATIVAN) 1 MG tablet Take 1-2 mg  by mouth 3 (three) times daily. Takes 1 mg in the morning at 8:00 am, 2 mg in the afternoon at 1400 and 2 mg in the evening 2000 And 1 tablet 30 minutes prior to procedure   Yes [provider]  LORazepam (ATIVAN) 2 MG tablet Take 2 mg by mouth. 30 minutes prior to procedure   Yes [provider]  Melatonin 3 MG CAPS Take 3 mg by mouth at bedtime.    Yes [provider]  QUEtiapine (SEROQUEL) 25 MG tablet Take 25-50 mg by mouth See admin instructions. 25 mg in the morning and 50 mg at bedtime   Yes [provider]  senna (SENOKOT) 8.6 MG  TABS tablet Take 1 tablet by mouth at bedtime.   Yes [provider]  vitamin B-12 (CYANOCOBALAMIN) 500 MCG tablet Take 500 mcg by mouth daily.   Yes [provider]  Vitamins A & D (VITAMIN A & D) ointment Apply 1 application topically daily. To lower legs and arms daily   Yes [provider]    Physical Exam: Vitals:   08/31/18 0100 08/31/18 0130 08/31/18 0230 08/31/18 0300  BP: (!) 85/57 (!) 87/64 (!) 94/57 90/66  Pulse: 60 71 72 (!) 59  Resp: 18 16 (!) 21 (!) 22  Temp:      TempSrc:      SpO2: 97% 96% 97% 95%      Constitutional: Moderately built and nourished. Vitals:   08/31/18 0100 08/31/18 0130 08/31/18 0230 08/31/18 0300  BP: (!) 85/57 (!) 87/64 (!) 94/57 90/66  Pulse: 60 71 72 (!) 59  Resp: 18 16 (!) 21 (!) 22  Temp:      TempSrc:      SpO2: 97% 96% 97% 95%   Eyes: Anicteric no pallor. ENMT: No discharge from the ears eyes nose or mouth. Neck: No mass felt.  No neck rigidity. Respiratory: No rhonchi or crepitations. Cardiovascular: S1-S2 heard. Abdomen: Soft nontender bowel sounds present. Musculoskeletal: Right foot contracture. Skin: No rash. Neurologic: Patient is mildly lethargic but easily arousable does not follow commands. Psychiatric: Mildly lethargic.   Labs on Admission: I have personally reviewed following labs and imaging studies  CBC: Recent Labs  Lab 08/30/18 2257  WBC 3.0*  NEUTROABS 1.8  HGB 11.4*  HCT 34.0*  MCV 93.7  PLT 630*   Basic Metabolic Panel: Recent Labs  Lab 08/30/18 2257  NA 139  K 3.2*  CL 103  CO2 27  GLUCOSE 104*  BUN 19  CREATININE 0.67  CALCIUM 8.0*   GFR: Estimated Creatinine Clearance: 60.6 mL/min (by C-G formula based on SCr of 0.67 mg/dL). Liver Function Tests: Recent Labs  Lab 08/30/18 2257  AST 24  ALT 17  ALKPHOS 73  BILITOT 0.1*  PROT 6.3*  ALBUMIN 3.0*   No results for input(s): LIPASE, AMYLASE in the last 168 hours. No results for input(s): AMMONIA in the  last 168 hours. Coagulation Profile: No results for input(s): INR, PROTIME in the last 168 hours. Cardiac Enzymes: No results for input(s): CKTOTAL, CKMB, CKMBINDEX, TROPONINI in the last 168 hours. BNP (last 3 results) No results for input(s): PROBNP in the last 8760 hours. HbA1C: No results for input(s): HGBA1C in the last 72 hours. CBG: No results for input(s): GLUCAP in the last 168 hours. Lipid Profile: Recent Labs    08/30/18 2257  TRIG 64   Thyroid Function Tests: No results for input(s): TSH, T4TOTAL, FREET4, T3FREE, THYROIDAB in the last 72 hours.  Anemia Panel: Recent Labs    08/30/18 2257  FERRITIN 39   Urine analysis: No results found for: COLORURINE, APPEARANCEUR, LABSPEC, PHURINE, GLUCOSEU, HGBUR, BILIRUBINUR, KETONESUR, PROTEINUR, UROBILINOGEN, NITRITE, LEUKOCYTESUR Sepsis Labs: @LABRCNTIP (procalcitonin:4,lacticidven:4) ) Recent Results (from the past 240 hour(s))  SARS Coronavirus 2 (CEPHEID- Performed in [facility] hospital lab), Hosp Order     Status: Abnormal   Collection Time: 08/24/18  7:46 AM   Specimen: Nasopharyngeal Swab  Result Value Ref Range Status   SARS Coronavirus 2 POSITIVE (A) NEGATIVE Final    Comment: RESULT CALLED TO, READ BACK BY AND VERIFIED WITH: LEWIS,A. RN @1118  ON 6.8.2020 BY NMCCOY (NOTE) If result is NEGATIVE SARS-CoV-2 target nucleic acids are NOT DETECTED. The SARS-CoV-2 RNA is generally detectable in upper and lower  respiratory specimens during the acute phase of infection. The lowest  concentration of SARS-CoV-2 viral copies this assay can detect is 250  copies / mL. A negative result does not preclude SARS-CoV-2 infection  and should not be used as the sole basis for treatment or other  patient management decisions.  A negative result may occur with  improper specimen collection / handling, submission of specimen other  than nasopharyngeal swab, presence of viral mutation(s) within the  areas targeted by this assay,  and inadequate number of viral copies  (<250 copies / mL). A negative result must be combined with clinical  observations, patient history, and epidemiological information. If result is POSITIVE SARS-CoV-2 target nucleic acids are DETECTED . The SARS-CoV-2 RNA is generally detectable in upper and lower  respiratory specimens during the acute phase of infection.  Positive  results are indicative of active infection with SARS-CoV-2.  Clinical  correlation with patient history and other diagnostic information is  necessary to determine patient infection status.  Positive results do  not rule out bacterial infection or co-infection with other viruses. If result is PRESUMPTIVE POSTIVE SARS-CoV-2 nucleic acids MAY BE PRESENT.   A presumptive positive result was obtained on the submitted specimen  and confirmed on repeat testing.  While 2019 novel coronavirus  (SARS-CoV-2) nucleic acids may be present in the submitted sample  additional confirmatory testing may be necessary for epidemiological  and / or clinical management purposes  to differentiate between  SARS-CoV-2 and other Sarbecovirus currently known to infect humans.  If clinically indicated additional testing with an alternate test  methodology [phone]([id number] ) is advised. The SARS-CoV-2 RNA is generally  detectable in upper and lower respiratory specimens during the acute  phase of infection. The expected result is Negative. Fact Sheet for Patients:  BoilerBrush.com.cyhttps://www.fda.gov/media/136312/download Fact Sheet for Healthcare Providers: https://pope.com/https://www.fda.gov/media/136313/download This test is not yet approved or cleared by the [place] FDA and has been authorized for detection and/or diagnosis of SARS-CoV-2 by FDA under an Emergency Use Authorization (EUA).  This EUA will remain in effect (meaning this test can be used) for the duration of the COVID-19 declaration under Section 564(b)(1) of the Act, 21 U.S.C. section 360bbb-3(b)(1), unless  the authorization is terminated or revoked sooner. Performed at [facility], [address] [place], [place] [address]      Radiological Exams on Admission: Dg Chest Port 1 View  Result Date: 08/30/2018 CLINICAL DATA:  Hypotensive. Lethargy. COVID positive. EXAM: PORTABLE CHEST 1 VIEW COMPARISON:  None. FINDINGS: Patchy bibasilar airspace disease, right greater than left. Lesser patchy opacities in the right mid and upper lung zones. Lung volumes are low. Upper normal heart size likely accentuated by technique and low lung volumes.  No evidence of pleural effusion or pneumothorax. Remote midshaft right clavicle fracture. IMPRESSION: Multifocal bibasilar predominant airspace disease consistent with atypical viral pneumonia in this patient with known COVID infection. Electronically Signed   By: Narda RutherfordMelanie  Sanford M.D.   On: 08/30/2018 23:45    EKG: Independently reviewed.  Normal sinus rhythm atrial premature complex.  Assessment/Plan Principal Problem:   Pneumonia due to COVID-19 virus Active Problems:   Autism    1. Multifocal pneumonia secondary to COVID-19 -[facility].  Will discuss with pharmacy about Remdesivir.  Recheck CRP ferritin levels. 2. SIRS features likely from COVID.  Improved with fluids.  Will hold off any further fluids for now and closely monitor blood pressure trends.  Follow blood cultures. 3. Pancytopenia appears to be new.  Could be from COVID-19.  Has had previous history of anemia but at this time patient is leukocyte and platelet counts are decreasing.  Closely observe. 4. History of autism on multiple behavioral medication including Seroquel Depakote Ativan.  Since patient is lethargic and holding all of these medications for now and will check Depakote and ammonia levels.  Once patient is more alert awake may have to restart these medications.   DVT prophylaxis: Lovenox. Code Status: Full code as confirmed  with patient's brother. Family Communication: Patient's brother and caregiver. Disposition Plan: Back to group home once stable. Consults called: None. Admission status: Inpatient.   Eduard ClosArshad N  MD [facility] Pager [phone].  If 7PM-7AM, please contact night-coverage [email or web address] Password [email or web address]  08/31/2018, 3:48 AM

## 2018-08-31 NOTE — ED Notes (Signed)
[facility] at bedside 

## 2018-08-31 NOTE — Progress Notes (Signed)
Patient comes from a group home and is autistic.  Patient is also nonverbal and refuses to take anything by mouth.  Patient is alert, but unable to tell orientation.  Patient's bs have been on the lower end of normal today.  Her bp have been very soft, usually 90s/50s on average.  Patient refuses to wear her oxygen and her oxygen sats remain in the mid 80s.  While trying to get patient to take a morning medication, she may have aspirated.  Patient seems sleepy unless we turn her or change her linen or try to put on her oxygen.  Patient pulled out one of her IVs this am.  Lab came to draw blood this morning and was able to get very little blood.  MD notified

## 2018-08-31 NOTE — Progress Notes (Signed)
PROGRESS NOTE  Hannah Houston WUJ:[id number] DOB: 12/20/1970 DOA: 08/30/2018 PCP: Lucretia Fieldoyals, [place] M   LOS: 0 days   Brief Narrative / Interim history: 48 year old female with history of autism was brought to the ER on August 24, 2018 due to fevers and cough, was diagnosed with COVID-19 and was discharged back to group home since she was not hypoxic.  Over the last several days she has becoming more lethargic, poor appetite, febrile and feels like her symptoms are getting worse and was brought to the ED and was admitted on 08/30/2018.  Subjective: -Patient is nonverbal, does not interact with me.  Eyes are open and pushes my stethoscope aside but does not communicate  Assessment & Plan: Principal Problem:   Pneumonia due to COVID-19 virus Active Problems:   Autism   Principal Problem Acute Hypoxic Respiratory Failure due to Covid-19 Viral Illness -Acute hypoxic respiratory failure due to multifocal pneumonia in the setting of viral illness with COVID-19 -Patient was started on Remdesivir on 6/15, continue for 5 days -Inflammatory markers mildly elevated with CRP at 1.6, d-dimer 0.5 and LDH 195.  Ferritin is only 39.  Continue to monitor -Start steroids as well -She is hypoxic with sats in the upper 80s 90% on room air, constantly removing supplemental oxygen.  Discussed with patient's brother over the phone that this potentially can be an issue given her uncooperativeness with treatment  The treatment plan and use of medications and known side effects were discussed with patient/family, they were clearly explained that there is no proven definitive treatment for COVID-19 infection, any medications used here are based on published clinical articles/anecdotal data which are not peer-reviewed or randomized control trials.  Complete risks and long-term side effects are unknown, however in the best clinical judgment they seem to be of some clinical benefit rather than medical risks.  Patient agree  with the treatment plan and want to receive the given medications.  COVID-19 Labs  Recent Labs    08/30/18 2257  DDIMER 0.51*  FERRITIN 39  LDH 195*  CRP 1.6*    Lab Results  Component Value Date   SARSCOV2NAA POSITIVE (A) 08/24/2018    Active Problems Pancytopenia -Likely in the setting of COVID-19, continue to closely monitor  History of autism -On multiple behavioral medications, resume Seroquel, Depakote, Ativan.  She is no longer lethargic  Scheduled Meds:  enoxaparin (LOVENOX) injection  40 mg Subcutaneous Q24H   potassium chloride  30 mEq Oral Q1 Hr x 2   QUEtiapine  25 mg Oral BID   Continuous Infusions:  remdesivir 200 mg in NS 250 mL     Followed by   Melene Muller[START ON 09/01/2018] remdesivir 100 mg in NS 250 mL     valproate sodium     PRN Meds:.acetaminophen **OR** acetaminophen, LORazepam, ondansetron **OR** ondansetron (ZOFRAN) IV  DVT prophylaxis: Lovenox Code Status: Full code Family Communication: d/w brother Minerva Areolaric [phone] over the phone Disposition Plan: TBD  Consultants:   None   Procedures:   None   Antimicrobials:  Remdesivir 6/15 >>   Objective: Vitals:   08/31/18 0430 08/31/18 0546 08/31/18 0600 08/31/18 0754  BP: 103/79  (!) 143/120   Pulse: 66  76   Resp: (!) 25  (!) 26   Temp:  99.3 F (37.4 C)  99.9 F (37.7 C)  TempSrc:  Axillary  Oral  SpO2: 96%  95%    No intake or output data in the 24 hours ending 08/31/18 0928 There were no vitals  filed for this visit.  Examination:  Constitutional: NAD Eyes: PERRL, lids and conjunctivae normal ENMT: Mucous membranes are moist. Respiratory: clear to auscultation bilaterally, no wheezing, no crackles. Shallow breathing Cardiovascular: Regular rate and rhythm, no murmurs / rubs / gallops. No LE edema. Abdomen: no tenderness. Bowel sounds positive.  Skin: no rashes Neurologic: moves all 4 independently, does not follow commands   Data Reviewed: I have independently  reviewed following labs and imaging studies    CBC: Recent Labs  Lab 09-26-18 2257  WBC 3.0*  NEUTROABS 1.8  HGB 11.4*  HCT 34.0*  MCV 93.7  PLT 810*   Basic Metabolic Panel: Recent Labs  Lab 09-26-18 2257  NA 139  K 3.2*  CL 103  CO2 27  GLUCOSE 104*  BUN 19  CREATININE 0.67  CALCIUM 8.0*   GFR: Estimated Creatinine Clearance: 60.6 mL/min (by C-G formula based on SCr of 0.67 mg/dL). Liver Function Tests: Recent Labs  Lab 2018-09-26 2257  AST 24  ALT 17  ALKPHOS 73  BILITOT 0.1*  PROT 6.3*  ALBUMIN 3.0*   No results for input(s): LIPASE, AMYLASE in the last 168 hours. Recent Labs  Lab 08/31/18 0730  AMMONIA 32   Coagulation Profile: No results for input(s): INR, PROTIME in the last 168 hours. Cardiac Enzymes: No results for input(s): CKTOTAL, CKMB, CKMBINDEX, TROPONINI in the last 168 hours. BNP (last 3 results) No results for input(s): PROBNP in the last 8760 hours. HbA1C: No results for input(s): HGBA1C in the last 72 hours. CBG: Recent Labs  Lab 08/31/18 0751  GLUCAP 71   Lipid Profile: Recent Labs    September 26, 2018 2257  TRIG 64   Thyroid Function Tests: No results for input(s): TSH, T4TOTAL, FREET4, T3FREE, THYROIDAB in the last 72 hours. Anemia Panel: Recent Labs    September 26, 2018 2257  FERRITIN 39   Urine analysis: No results found for: COLORURINE, APPEARANCEUR, LABSPEC, PHURINE, GLUCOSEU, HGBUR, BILIRUBINUR, KETONESUR, PROTEINUR, UROBILINOGEN, NITRITE, LEUKOCYTESUR Sepsis Labs: Invalid input(s): PROCALCITONIN, LACTICIDVEN  Recent Results (from the past 240 hour(s))  SARS Coronavirus 2 (CEPHEID- Performed in [facility] hospital lab), Hosp Order     Status: Abnormal   Collection Time: 08/24/18  7:46 AM   Specimen: Nasopharyngeal Swab  Result Value Ref Range Status   SARS Coronavirus 2 POSITIVE (A) NEGATIVE Final    Comment: RESULT CALLED TO, READ BACK BY AND VERIFIED WITH: LEWIS,A. RN @1118  ON 6.8.2020 BY NMCCOY (NOTE) If result is  NEGATIVE SARS-CoV-2 target nucleic acids are NOT DETECTED. The SARS-CoV-2 RNA is generally detectable in upper and lower  respiratory specimens during the acute phase of infection. The lowest  concentration of SARS-CoV-2 viral copies this assay can detect is 250  copies / mL. A negative result does not preclude SARS-CoV-2 infection  and should not be used as the sole basis for treatment or other  patient management decisions.  A negative result may occur with  improper specimen collection / handling, submission of specimen other  than nasopharyngeal swab, presence of viral mutation(s) within the  areas targeted by this assay, and inadequate number of viral copies  (<250 copies / mL). A negative result must be combined with clinical  observations, patient history, and epidemiological information. If result is POSITIVE SARS-CoV-2 target nucleic acids are DETECTED . The SARS-CoV-2 RNA is generally detectable in upper and lower  respiratory specimens during the acute phase of infection.  Positive  results are indicative of active infection with SARS-CoV-2.  Clinical  correlation with patient  history and other diagnostic information is  necessary to determine patient infection status.  Positive results do  not rule out bacterial infection or co-infection with other viruses. If result is PRESUMPTIVE POSTIVE SARS-CoV-2 nucleic acids MAY BE PRESENT.   A presumptive positive result was obtained on the submitted specimen  and confirmed on repeat testing.  While 2019 novel coronavirus  (SARS-CoV-2) nucleic acids may be present in the submitted sample  additional confirmatory testing may be necessary for epidemiological  and / or clinical management purposes  to differentiate between  SARS-CoV-2 and other Sarbecovirus currently known to infect humans.  If clinically indicated additional testing with an alternate test  methodology [phone]([id number] ) is advised. The SARS-CoV-2 RNA is generally  detectable  in upper and lower respiratory specimens during the acute  phase of infection. The expected result is Negative. Fact Sheet for Patients:  BoilerBrush.com.cyhttps://www.fda.gov/media/136312/download Fact Sheet for Healthcare Providers: https://pope.com/https://www.fda.gov/media/136313/download This test is not yet approved or cleared by the [place] FDA and has been authorized for detection and/or diagnosis of SARS-CoV-2 by FDA under an Emergency Use Authorization (EUA).  This EUA will remain in effect (meaning this test can be used) for the duration of the COVID-19 declaration under Section 564(b)(1) of the Act, 21 U.S.C. section 360bbb-3(b)(1), unless the authorization is terminated or revoked sooner. Performed at [facility], [address] [place], [place] [address]       Radiology Studies: Dg Chest Port 1 View  Result Date: 08/30/2018 CLINICAL DATA:  Hypotensive. Lethargy. COVID positive. EXAM: PORTABLE CHEST 1 VIEW COMPARISON:  None. FINDINGS: Patchy bibasilar airspace disease, right greater than left. Lesser patchy opacities in the right mid and upper lung zones. Lung volumes are low. Upper normal heart size likely accentuated by technique and low lung volumes. No evidence of pleural effusion or pneumothorax. Remote midshaft right clavicle fracture. IMPRESSION: Multifocal bibasilar predominant airspace disease consistent with atypical viral pneumonia in this patient with known COVID infection. Electronically Signed   By: Narda RutherfordMelanie  Sanford M.D.   On: 08/30/2018 23:45    Pamella Pertostin Emiline Mancebo, MD, PhD [facility]  Contact via  [email or web address]  [facility] Office Info P: [phone]  F: [phone]

## 2018-08-31 NOTE — Progress Notes (Signed)
Pharmacy Brief Note  O:  ALT: 24 CXR: Multifocal bibasilar predominant airspace disease consistent with atypical viral pneumonia in this patient with known COVID infection. SpO2: supplement O2 ordrered - Pt resisting nasal cannula   A/P:  Patient meets criteria for remdesivir. Will initiate remdesivir 200 mg once followed by 100 mg daily x 4 days  Royetta Asal, PharmD, BCPS Pager [phone] 08/31/2018 7:11 AM

## 2018-08-31 NOTE — ED Notes (Signed)
[facility] called and paperwork printed.  

## 2018-09-01 LAB — COMPREHENSIVE METABOLIC PANEL
ALT: 14 U/L (ref 0–44)
AST: 21 U/L (ref 15–41)
Albumin: 2.5 g/dL — ABNORMAL LOW (ref 3.5–5.0)
Alkaline Phosphatase: 62 U/L (ref 38–126)
Anion gap: 12 (ref 5–15)
BUN: 12 mg/dL (ref 6–20)
CO2: 22 mmol/L (ref 22–32)
Calcium: 7.4 mg/dL — ABNORMAL LOW (ref 8.9–10.3)
Chloride: 107 mmol/L (ref 98–111)
Creatinine, Ser: 0.45 mg/dL (ref 0.44–1.00)
GFR calc Af Amer: >60 mL/min (ref >60)
GFR calc non Af Amer: >60 mL/min (ref >60)
Glucose, Bld: 113 mg/dL — ABNORMAL HIGH (ref 70–99)
Potassium: 3.7 mmol/L (ref 3.5–5.1)
Sodium: 141 mmol/L (ref 135–145)
Total Bilirubin: 0.5 mg/dL (ref 0.3–1.2)
Total Protein: 5.7 g/dL — ABNORMAL LOW (ref 6.5–8.1)

## 2018-09-01 LAB — D-DIMER, QUANTITATIVE: D-Dimer, Quant: 1.04 ug/mL-FEU — ABNORMAL HIGH (ref 0.00–0.50)

## 2018-09-01 LAB — CBC
HCT: 36.3 % (ref 36.0–46.0)
Hemoglobin: 12 g/dL (ref 12.0–15.0)
MCH: 30.6 pg (ref 26.0–34.0)
MCHC: 33.1 g/dL (ref 30.0–36.0)
MCV: 92.6 fL (ref 80.0–100.0)
Platelets: 135 10*3/uL — ABNORMAL LOW (ref 150–400)
RBC: 3.92 MIL/uL (ref 3.87–5.11)
RDW: 12.5 % (ref 11.5–15.5)
WBC: 4.2 10*3/uL (ref 4.0–10.5)
nRBC: 0 % (ref 0.0–0.2)

## 2018-09-01 LAB — GLUCOSE, CAPILLARY
Glucose-Capillary: 100 mg/dL — ABNORMAL HIGH (ref 70–99)
Glucose-Capillary: 108 mg/dL — ABNORMAL HIGH (ref 70–99)
Glucose-Capillary: 111 mg/dL — ABNORMAL HIGH (ref 70–99)
Glucose-Capillary: 118 mg/dL — ABNORMAL HIGH (ref 70–99)
Glucose-Capillary: 131 mg/dL — ABNORMAL HIGH (ref 70–99)
Glucose-Capillary: 135 mg/dL — ABNORMAL HIGH (ref 70–99)

## 2018-09-01 LAB — HIV ANTIBODY (ROUTINE TESTING W REFLEX): HIV Screen 4th Generation wRfx: NONREACTIVE

## 2018-09-01 LAB — LACTATE DEHYDROGENASE: LDH: 245 U/L — ABNORMAL HIGH (ref 98–192)

## 2018-09-01 LAB — C-REACTIVE PROTEIN: CRP: 2.3 mg/dL — ABNORMAL HIGH (ref <1.0)

## 2018-09-01 LAB — FERRITIN: Ferritin: 50 ng/mL (ref 11–307)

## 2018-09-01 MED ORDER — SODIUM CHLORIDE 0.9 % IV SOLN
INTRAVENOUS | Status: DC
  Administered 2018-09-01 (×2): via INTRAVENOUS

## 2018-09-01 NOTE — Plan of Care (Signed)

## 2018-09-01 NOTE — Progress Notes (Signed)
PROGRESS NOTE  Hannah CoverCrystal D Houston ZOX:[id number] DOB: 07/16/1970 DOA: 08/30/2018 PCP: Lucretia Fieldoyals, [place] M   LOS: 1 day   Brief Narrative / Interim history: 48 year old female with history of autism was brought to the ER on August 24, 2018 due to fevers and cough, was diagnosed with COVID-19 and was discharged back to group home since she was not hypoxic.  Over the last several days she has becoming more lethargic, poor appetite, febrile and feels like her symptoms are getting worse and was brought to the ED and was admitted on 08/30/2018.  Subjective: -Nonverbal, not interactive.  Eyes are open and seems alert.  Assessment & Plan: Principal Problem:   Pneumonia due to COVID-19 virus Active Problems:   Autism   Principal Problem Acute Hypoxic Respiratory Failure due to Covid-19 Viral Illness -Acute hypoxic respiratory failure due to multifocal pneumonia in the setting of viral illness with COVID-19 -Patient was started on Remdesivir on 6/15, continue for 5 days -Continue to monitor inflammatory markers -Patient was started on steroids -Hypoxia seems to improve, she is 100% on room air this morning  The treatment plan and use of medications and known side effects were discussed with patient/family, they were clearly explained that there is no proven definitive treatment for COVID-19 infection, any medications used here are based on published clinical articles/anecdotal data which are not peer-reviewed or randomized control trials.  Complete risks and long-term side effects are unknown, however in the best clinical judgment they seem to be of some clinical benefit rather than medical risks.  Patient agree with the treatment plan and want to receive the given medications.  COVID-19 Labs  Recent Labs    08/30/18 2257 08/31/18 0730 08/31/18 1410 09/01/18 0506  DDIMER 0.51*  --   --  1.04*  FERRITIN 39 39  --  50  LDH 195*  --  213* 245*  CRP 1.6* 1.4*  --  2.3*    Lab Results  Component  Value Date   SARSCOV2NAA POSITIVE (A) 08/24/2018    Active Problems Pancytopenia -Likely in the setting of COVID-19, continue to closely monitor  History of autism -On multiple behavioral medications, resumed Seroquel, Depakote, Ativan.   Scheduled Meds: . enoxaparin (LOVENOX) injection  40 mg Subcutaneous Q24H  . methylPREDNISolone (SOLU-MEDROL) injection  30 mg Intravenous Q12H  . QUEtiapine  25 mg Oral BID   Continuous Infusions: . sodium chloride 75 mL/hr at 09/01/18 0500  . remdesivir 100 mg in NS 250 mL 100 mg (09/01/18 0922)  . valproate sodium 250 mg (09/01/18 1100)   PRN Meds:.acetaminophen **OR** acetaminophen, LORazepam, ondansetron **OR** ondansetron (ZOFRAN) IV  DVT prophylaxis: Lovenox Code Status: Full code Family Communication: d/w brother Minerva Areolaric [phone] over the phone Disposition Plan: TBD  Consultants:   None   Procedures:   None   Antimicrobials:  Remdesivir 6/15 >>   Objective: Vitals:   09/01/18 0853 09/01/18 0900 09/01/18 1157 09/01/18 1159  BP:    (!) 80/47  Pulse:  (!) 44    Resp:  16    Temp: 97.6 F (36.4 C)  97.7 F (36.5 C)   TempSrc: Axillary  Axillary   SpO2:  100%    Weight:      Height:        Intake/Output Summary (Last 24 hours) at 09/01/2018 1210 Last data filed at 09/01/2018 0500 Gross per 24 hour  Intake 1547.07 ml  Output -  Net 1547.07 ml   Filed Weights   08/31/18 1225 09/01/18 0500  Weight: 55 kg 56 kg    Examination:  Constitutional: NAD Eyes: No scleral icterus ENMT: Moist mucous membranes Respiratory: Clear bilaterally on anterior auscultation without wheezing or crackles Cardiovascular: Regular rate and rhythm, no murmurs.  No edema Abdomen: Soft, nontender, nondistended, bowel sounds positive Skin: No new rashes Neurologic: Moves all 4 extremities independently, does not follow commands   Data Reviewed: I have independently reviewed following labs and imaging studies    CBC: Recent Labs   Lab 08/30/18 2257 09/01/18 0506  WBC 3.0* 4.2  NEUTROABS 1.8  --   HGB 11.4* 12.0  HCT 34.0* 36.3  MCV 93.7 92.6  PLT 113* 510*   Basic Metabolic Panel: Recent Labs  Lab 08/30/18 2257 09/01/18 0506  NA 139 141  K 3.2* 3.7  CL 103 107  CO2 27 22  GLUCOSE 104* 113*  BUN 19 12  CREATININE 0.67 0.45  CALCIUM 8.0* 7.4*   GFR: Estimated Creatinine Clearance: 61.3 mL/min (by C-G formula based on SCr of 0.45 mg/dL). Liver Function Tests: Recent Labs  Lab 08/30/18 2257 08/31/18 1410 09/01/18 0506  AST 24 23 21   ALT 17 14 14   ALKPHOS 73 68 62  BILITOT 0.1* 0.5 0.5  PROT 6.3* 6.1* 5.7*  ALBUMIN 3.0* 2.9* 2.5*   No results for input(s): LIPASE, AMYLASE in the last 168 hours. Recent Labs  Lab 08/31/18 0730  AMMONIA 32   Coagulation Profile: No results for input(s): INR, PROTIME in the last 168 hours. Cardiac Enzymes: Recent Labs  Lab 08/31/18 1410  TROPONINI <0.03   BNP (last 3 results) No results for input(s): PROBNP in the last 8760 hours. HbA1C: No results for input(s): HGBA1C in the last 72 hours. CBG: Recent Labs  Lab 08/31/18 1641 08/31/18 2111 09/01/18 0019 09/01/18 0348 09/01/18 0849  GLUCAP 118* 112* 135* 111* 108*   Lipid Profile: Recent Labs    08/30/18 2257  TRIG 64   Thyroid Function Tests: No results for input(s): TSH, T4TOTAL, FREET4, T3FREE, THYROIDAB in the last 72 hours. Anemia Panel: Recent Labs    08/31/18 0730 09/01/18 0506  FERRITIN 39 50   Urine analysis: No results found for: COLORURINE, APPEARANCEUR, LABSPEC, PHURINE, GLUCOSEU, HGBUR, BILIRUBINUR, KETONESUR, PROTEINUR, UROBILINOGEN, NITRITE, LEUKOCYTESUR Sepsis Labs: Invalid input(s): PROCALCITONIN, LACTICIDVEN  Recent Results (from the past 240 hour(s))  SARS Coronavirus 2 (CEPHEID- Performed in [place] hospital lab), Hosp Order     Status: Abnormal   Collection Time: 08/24/18  7:46 AM   Specimen: Nasopharyngeal Swab  Result Value Ref Range Status   SARS  Coronavirus 2 POSITIVE (A) NEGATIVE Final    Comment: RESULT CALLED TO, READ BACK BY AND VERIFIED WITH: LEWIS,A. RN @1118  ON 6.8.2020 BY NMCCOY (NOTE) If result is NEGATIVE SARS-CoV-2 target nucleic acids are NOT DETECTED. The SARS-CoV-2 RNA is generally detectable in upper and lower  respiratory specimens during the acute phase of infection. The lowest  concentration of SARS-CoV-2 viral copies this assay can detect is 250  copies / mL. A negative result does not preclude SARS-CoV-2 infection  and should not be used as the sole basis for treatment or other  patient management decisions.  A negative result may occur with  improper specimen collection / handling, submission of specimen other  than nasopharyngeal swab, presence of viral mutation(s) within the  areas targeted by this assay, and inadequate number of viral copies  (<250 copies / mL). A negative result must be combined with clinical  observations, patient history, and epidemiological information. If  result is POSITIVE SARS-CoV-2 target nucleic acids are DETECTED . The SARS-CoV-2 RNA is generally detectable in upper and lower  respiratory specimens during the acute phase of infection.  Positive  results are indicative of active infection with SARS-CoV-2.  Clinical  correlation with patient history and other diagnostic information is  necessary to determine patient infection status.  Positive results do  not rule out bacterial infection or co-infection with other viruses. If result is PRESUMPTIVE POSTIVE SARS-CoV-2 nucleic acids MAY BE PRESENT.   A presumptive positive result was obtained on the submitted specimen  and confirmed on repeat testing.  While 2019 novel coronavirus  (SARS-CoV-2) nucleic acids may be present in the submitted sample  additional confirmatory testing may be necessary for epidemiological  and / or clinical management purposes  to differentiate between  SARS-CoV-2 and other Sarbecovirus currently known  to infect humans.  If clinically indicated additional testing with an alternate test  methodology [id number] ) is advised. The SARS-CoV-2 RNA is generally  detectable in upper and lower respiratory specimens during the acute  phase of infection. The expected result is Negative. Fact Sheet for Patients:  BoilerBrush.com.cyhttps://www.fda.gov/media/136312/download Fact Sheet for Healthcare Providers: https://pope.com/https://www.fda.gov/media/136313/download This test is not yet approved or cleared by the [place] FDA and has been authorized for detection and/or diagnosis of SARS-CoV-2 by FDA under an Emergency Use Authorization (EUA).  This EUA will remain in effect (meaning this test can be used) for the duration of the COVID-19 declaration under Section 564(b)(1) of the Act, 21 U.S.C. section 360bbb-3(b)(1), unless the authorization is terminated or revoked sooner. Performed at [facility], [address] [place], [place] [address]       Radiology Studies: Dg Chest Port 1 View  Result Date: 08/30/2018 CLINICAL DATA:  Hypotensive. Lethargy. COVID positive. EXAM: PORTABLE CHEST 1 VIEW COMPARISON:  None. FINDINGS: Patchy bibasilar airspace disease, right greater than left. Lesser patchy opacities in the right mid and upper lung zones. Lung volumes are low. Upper normal heart size likely accentuated by technique and low lung volumes. No evidence of pleural effusion or pneumothorax. Remote midshaft right clavicle fracture. IMPRESSION: Multifocal bibasilar predominant airspace disease consistent with atypical viral pneumonia in this patient with known COVID infection. Electronically Signed   By: Narda RutherfordMelanie  Sanford M.D.   On: 08/30/2018 23:45    Pamella Pertostin Lindy Garczynski, MD, PhD [facility]  Contact via  [email or web address]  [facility] Office Info P: [phone]  F: [phone]

## 2018-09-01 NOTE — Progress Notes (Signed)
Notified by RN that wrist restraints will expire shortly.  Chart reviewed, 48 year old woman PMH autism was admitted 6/15 for acute hypoxic respiratory failure secondary to COVID-19 multifocal pneumonia, started on steroids and remdesivir with improvement, currently on room air.  With autism, and noted patient on multiple behavioral medications, including Seroquel, Depakote, Ativan.  Per RN, uncooperative with care, remove the leads, removing IV, given autism, not able to redirect.  For patient safety, soft wrist restraints were placed bilateral wrists 6/15 for interference with medical treatment.  Patient is nonverbal Objective: Afebrile, 97.7, respiratory rate 16, pulse 60 SBP 70-90s, earlier today low 16X, 09U systolic.  Patient appears calm, comfortable Cardiovascular: Bradycardic, regular rhythm, no murmur, rub or gallop Respiratory: Clear to auscultation bilaterally.  No wheezes, rales or rhonchi. Arms appear grossly unremarkable, wrists and hands appear unremarkable.  Perfusion of both hands and fingers appears normal.  Loose wrist restraints. Patient does not cooperate with exam.  EKG on admission showed normal sinus rhythm with normal QTC.  Assessment/plan COVID-19 viral pneumonia with hypoxia resolved Autism on multiple medications Asymptomatic bradycardia present greater than 24 hours Soft blood pressure present intermittently since admission  For now will reorder soft wrist restraints to prevent unable of IV and interference with care (unintentional) given inability to redirect patient.  Continue to monitor on telemetry, continue treatments as per attending physician.  Consider discontinuing quetiapine if bradycardia continues.  Murray Hodgkins, MD [facility] [phone]  Time approximately 2145- 2200; 2340- 2352

## 2018-09-01 NOTE — Care Management Important Message (Signed)
Important Message  Patient Details  Name: Hannah Houston MRN: [id number] Date of Birth: 29-Jan-1971   Medicare Important Message Given:  Yes   IM mailed on 09/01/2018 by CMA , to emergency contact on file Jujhar Everett [address] 09/01/2018, 4:14 PM

## 2018-09-02 DIAGNOSIS — F840 Autistic disorder: Secondary | ICD-10-CM

## 2018-09-02 DIAGNOSIS — J988 Other specified respiratory disorders: Secondary | ICD-10-CM

## 2018-09-02 DIAGNOSIS — J1289 Other viral pneumonia: Secondary | ICD-10-CM

## 2018-09-02 DIAGNOSIS — U071 COVID-19: Principal | ICD-10-CM

## 2018-09-02 LAB — CBC WITH DIFFERENTIAL/PLATELET
Abs Immature Granulocytes: 0.04 10*3/uL (ref 0.00–0.07)
Basophils Absolute: 0 10*3/uL (ref 0.0–0.1)
Basophils Relative: 0 %
Eosinophils Absolute: 0 10*3/uL (ref 0.0–0.5)
Eosinophils Relative: 0 %
HCT: 35.7 % — ABNORMAL LOW (ref 36.0–46.0)
Hemoglobin: 11.9 g/dL — ABNORMAL LOW (ref 12.0–15.0)
Immature Granulocytes: 1 %
Lymphocytes Relative: 11 %
Lymphs Abs: 0.8 10*3/uL (ref 0.7–4.0)
MCH: 30.5 pg (ref 26.0–34.0)
MCHC: 33.3 g/dL (ref 30.0–36.0)
MCV: 91.5 fL (ref 80.0–100.0)
Monocytes Absolute: 0.3 10*3/uL (ref 0.1–1.0)
Monocytes Relative: 4 %
Neutro Abs: 5.6 10*3/uL (ref 1.7–7.7)
Neutrophils Relative %: 84 %
Platelets: 173 10*3/uL (ref 150–400)
RBC: 3.9 MIL/uL (ref 3.87–5.11)
RDW: 12.6 % (ref 11.5–15.5)
WBC: 6.6 10*3/uL (ref 4.0–10.5)
nRBC: 0 % (ref 0.0–0.2)

## 2018-09-02 LAB — D-DIMER, QUANTITATIVE: D-Dimer, Quant: 0.57 ug/mL-FEU — ABNORMAL HIGH (ref 0.00–0.50)

## 2018-09-02 LAB — GLUCOSE, CAPILLARY
Glucose-Capillary: 124 mg/dL — ABNORMAL HIGH (ref 70–99)
Glucose-Capillary: 140 mg/dL — ABNORMAL HIGH (ref 70–99)
Glucose-Capillary: 107 mg/dL — ABNORMAL HIGH (ref 70–99)
Glucose-Capillary: 142 mg/dL — ABNORMAL HIGH (ref 70–99)
Glucose-Capillary: 120 mg/dL — ABNORMAL HIGH (ref 70–99)
Glucose-Capillary: 186 mg/dL — ABNORMAL HIGH (ref 70–99)
Glucose-Capillary: 210 mg/dL — ABNORMAL HIGH (ref 70–99)

## 2018-09-02 LAB — C-REACTIVE PROTEIN: CRP: <0.8 mg/dL (ref <1.0)

## 2018-09-02 NOTE — Plan of Care (Signed)

## 2018-09-02 NOTE — Progress Notes (Signed)
OT Evaluation:  Clinical Impression: PTA Pt was living at group home, non-verbal, ambulatory without DME, and mod A for grooming/bathing/dressing, could feed herself. Pt is currently max A to total A for all ADL. She very softly pushed away attempts to engage her in self-care tasks - but allowed me to wash her face for her. She is min A +2 safety for transfers and in room mobility with 2 person HHA. Per her sister she likes music and art at the group home. OT will continue to follow and once out of restraints we will plan on taking her a coloring book. OT will follow acutely.     09/02/18 1500  OT Visit Information  Last OT Received On 09/02/18  Assistance Needed +2  PT/OT/SLP Co-Evaluation/Treatment Yes  Reason for Co-Treatment Necessary to address cognition/behavior during functional activity;For patient/therapist safety;To address functional/ADL transfers  PT goals addressed during session Mobility/safety with mobility;Balance  OT goals addressed during session ADL's and self-care  History of Present Illness 48 year old woman PMH autism was admitted 6/15 for acute hypoxic respiratory failure secondary to COVID-19 multifocal pneumonia  Precautions  Precautions Fall  Precaution Comments non-verbal  Restrictions  Weight Bearing Restrictions No  Home Living  Family/patient expects to be discharged to: Group home  Additional Comments 2 steps to get in the door, one level once inside, tub shower with bench (sister thinks)  Prior Function  Level of Independence Needs assistance  Gait / Transfers Assistance Needed no DME for gait  ADL's / [place] assist with most ADL (grooming/bathing/dressing)  Communication / Swallowing Assistance Needed non-verbal  Comments information from sister Programmer, multimedia Expressive difficulties (non-verbal)  Pain Assessment  Pain Assessment Faces  Faces Pain Scale 0  Pain Intervention(s) Monitored during session   Cognition  Arousal/Alertness Awake/alert  Behavior During Therapy WFL for tasks assessed/performed  Overall Cognitive Status History of cognitive impairments - at baseline  Upper Extremity Assessment  Upper Extremity Assessment Overall WFL for tasks assessed  Lower Extremity Assessment  Lower Extremity Assessment Defer to PT evaluation  Cervical / Trunk Assessment  Cervical / Trunk Assessment Normal  ADL  Overall ADL's  Needs assistance/impaired  Eating/Feeding Minimal assistance;Sitting  Eating/Feeding Details (indicate cue type and reason) attempted to get her to eat spaghetti or take a sip of drink, she pushed my hand away both times  Grooming Maximal assistance;Wash/dry face;Bed level  Grooming Details (indicate cue type and reason) Pt brought to face initially, but required max A for task completion  Upper Body Bathing Maximal assistance  Lower Body Bathing Total assistance  Upper Body Dressing  Maximal assistance  Lower Body Dressing Total assistance  Toilet Transfer Minimal assistance;+2 for safety/equipment;Ambulation (hand held assist)  Toileting- Clothing Manipulation and Hygiene Maximal assistance;+2 for safety/equipment;Sit to/from stand  Functional mobility during ADLs Minimal assistance;+2 for safety/equipment (hand held assist)  General ADL Comments Per sister max A at baseline - should follow up with staff at group home  Bed Mobility  Overal bed mobility Needs Assistance  Bed Mobility Supine to Sit;Sit to Supine  Supine to sit Min assist;+2 for safety/equipment  Sit to supine Min assist;+2 for safety/equipment  General bed mobility comments Min assist to come to sitting EOB and to return supine at end of session, multimodal cues for initiation.  Transfers  Overall transfer level Needs assistance  Equipment used 2 person hand held assist  Transfers Sit to/from Stand  Sit to Stand Min assist;+2 safety/equipment  General transfer comment min two  person hand held  assist to come to standing from bed.    Balance  Overall balance assessment Needs assistance  Sitting-balance support No upper extremity supported;Feet unsupported  Sitting balance-Leahy Scale Good  Sitting balance - Comments Pt sits in bed on her knees with both feet tucked under her buttocks.    Standing balance support Bilateral upper extremity supported  Standing balance-Leahy Scale Poor  Standing balance comment light bilateral hand held assist.  Second person for safety   General Comments  General comments (skin integrity, edema, etc.) VSS throughout session  OT - End of Session  Activity Tolerance Patient tolerated treatment well  Patient left in bed;with call bell/phone within reach;Other (comment);with restraints reapplied (all 4 rails up)  Nurse Communication Mobility status  OT Assessment  OT Recommendation/Assessment Patient needs continued OT Services  OT Visit Diagnosis Unsteadiness on feet (R26.81);Other abnormalities of gait and mobility (R26.89);Muscle weakness (generalized) (M62.81);Other symptoms and signs involving cognitive function  OT Problem List Decreased activity tolerance;Impaired balance (sitting and/or standing);Decreased safety awareness  OT Plan  OT Frequency (ACUTE ONLY) Min 2X/week  OT Treatment/Interventions (ACUTE ONLY) Self-care/ADL training;Therapeutic exercise;Therapeutic activities;Patient/family education;Balance training  AM-PAC OT "6 Clicks" Daily Activity Outcome Measure (Version 2)  Help from another person eating meals? 2  Help from another person taking care of personal grooming? 2  Help from another person toileting, which includes using toliet, bedpan, or urinal? 2  Help from another person bathing (including washing, rinsing, drying)? 2  Help from another person to put on and taking off regular upper body clothing? 2  Help from another person to put on and taking off regular lower body clothing? 1  6 Click Score 11  OT Recommendation   Follow Up Recommendations No OT follow up;Supervision/Assistance - 24 hour  OT Equipment Other (comment) (defer to group home)  Individuals Consulted  Consulted and Agree with Results and Recommendations Patient unable/family or caregiver not available  Acute Rehab OT Goals  Patient Stated Goal unable to state  OT Goal Formulation Patient unable to participate in goal setting  OT Time Calculation  OT Start Time (ACUTE ONLY) 1519  OT Stop Time (ACUTE ONLY) 1547  OT Time Calculation (min) 28 min  OT General Charges  $OT Visit 1 Visit  OT Evaluation  $OT Eval Moderate Complexity 1 Mod   Sherryl MangesLaura Desmond Szabo OTR/L Acute Rehabilitation Services Pager: [phone] Office: [phone]

## 2018-09-02 NOTE — Progress Notes (Signed)
PROGRESS NOTE  Hannah Houston WUJ:[id number] DOB: 04/09/1970 DOA: 08/30/2018 PCP: Lucretia Fieldoyals, [place] M   LOS: 2 days   Brief Narrative / Interim history: 48 year old female with history of autism was brought to the ER on August 24, 2018 due to fevers and cough, was diagnosed with COVID-19 and was discharged back to group home since she was not hypoxic.  Over the last several days she has becoming more lethargic, poor appetite, febrile and feels like her symptoms are getting worse and was brought to the ED and was admitted on 08/30/2018.  Subjective: -Nonverbal, not interactive.  As discussed with staff no significant events overnight, she remains somehow noncompliant, keep pulling her IV access, so she remains on restraints,  Assessment & Plan: Principal Problem:   Pneumonia due to COVID-19 virus Active Problems:   Autism   Acute Hypoxic Respiratory Failure due to Covid-19 Viral Illness -Acute hypoxic respiratory failure due to multifocal pneumonia in the setting of viral illness with COVID-19 -Patient was started on Remdesivir on 6/15, continue for 5 days -Laboratory markers trending down, which is reassuring, continue to monitor -Continue with IV Solu-Medrol -Hypoxia seems to improve, she is 100% on room air this morning  COVID-19 Labs  Recent Labs    08/30/18 2257 08/31/18 0730 08/31/18 1410 09/01/18 0506 09/02/18 0732  DDIMER 0.51*  --   --  1.04* 0.57*  FERRITIN 39 39  --  50  --   LDH 195*  --  213* 245*  --   CRP 1.6* 1.4*  --  2.3* <0.8    Lab Results  Component Value Date   SARSCOV2NAA POSITIVE (A) 08/24/2018    Active Problems Pancytopenia -Likely in the setting of COVID-19, resolved  History of autism -On multiple behavioral medications, resumed Seroquel, Depakote, Ativan.  - patient remains with very poor oral intake, have discussed with staff, encouraged to offer her ensures, and ice cream as she likes.Discussed with the brother, she like spaghetti, will  try to offer her some today.  Bradycardia -Heart rate in the 40s, and she is sleeping comfortably, when she is more awake and goes up in the 60s, telemetry, no evidence of heart block or pauses.  Scheduled Meds: . enoxaparin (LOVENOX) injection  40 mg Subcutaneous Q24H  . methylPREDNISolone (SOLU-MEDROL) injection  30 mg Intravenous Q12H  . QUEtiapine  25 mg Oral BID   Continuous Infusions: . remdesivir 100 mg in NS 250 mL 100 mg (09/02/18 0951)  . valproate sodium 250 mg (09/02/18 1058)   PRN Meds:.acetaminophen **OR** acetaminophen, LORazepam, ondansetron **OR** ondansetron (ZOFRAN) IV  DVT prophylaxis: Lovenox Code Status: Full code Family Communication: d/w brother Minerva Areolaric [phone] over the phone Disposition Plan: TBD  Consultants:   None   Procedures:   None   Antimicrobials:  Remdesivir 6/15 >>   Objective: Vitals:   09/02/18 0800 09/02/18 1137 09/02/18 1200 09/02/18 1230  BP: 109/70  108/65   Pulse: (!) 42  67 62  Resp: 12  16 16   Temp:  (!) 97.4 F (36.3 C)    TempSrc:  Axillary    SpO2: 98%  97% 97%  Weight:      Height:        Intake/Output Summary (Last 24 hours) at 09/02/2018 1418 Last data filed at 09/02/2018 0951 Gross per 24 hour  Intake 2036.19 ml  Output -  Net 2036.19 ml   Filed Weights   08/31/18 1225 09/01/18 0500  Weight: 55 kg 56 kg    Examination:  Constitutional: No apparent distress Eyes: No scleral icterus ENMT: Moist mucous membranes Respiratory: Air entry bilaterally anteriorly, no wheezing or crackles, no use of accessory muscles  Cardiovascular: Mildly bradycardic, no rubs murmurs or gallops Abdomen: [place] soft, nontender, nondistended, bowel sounds present  skin: No new rashes Neurologic: Grossly intact, moves all 4 extremities independently, does not follow commands or answer questions   Data Reviewed: I have independently reviewed following labs and imaging studies    CBC: Recent Labs  Lab Sep 26, 2018 2257  09/01/18 0506 09/02/18 0732  WBC 3.0* 4.2 6.6  NEUTROABS 1.8  --  5.6  HGB 11.4* 12.0 11.9*  HCT 34.0* 36.3 35.7*  MCV 93.7 92.6 91.5  PLT 113* 135* 481   Basic Metabolic Panel: Recent Labs  Lab 09-26-2018 2257 09/01/18 0506  NA 139 141  K 3.2* 3.7  CL 103 107  CO2 27 22  GLUCOSE 104* 113*  BUN 19 12  CREATININE 0.67 0.45  CALCIUM 8.0* 7.4*   GFR: Estimated Creatinine Clearance: 61.3 mL/min (by C-G formula based on SCr of 0.45 mg/dL). Liver Function Tests: Recent Labs  Lab 09/26/2018 2257 08/31/18 1410 09/01/18 0506  AST 24 23 21   ALT 17 14 14   ALKPHOS 73 68 62  BILITOT 0.1* 0.5 0.5  PROT 6.3* 6.1* 5.7*  ALBUMIN 3.0* 2.9* 2.5*   No results for input(s): LIPASE, AMYLASE in the last 168 hours. Recent Labs  Lab 08/31/18 0730  AMMONIA 32   Coagulation Profile: No results for input(s): INR, PROTIME in the last 168 hours. Cardiac Enzymes: Recent Labs  Lab 08/31/18 1410  TROPONINI <0.03   BNP (last 3 results) No results for input(s): PROBNP in the last 8760 hours. HbA1C: No results for input(s): HGBA1C in the last 72 hours. CBG: Recent Labs  Lab 09/01/18 1956 09/02/18 0057 09/02/18 0359 09/02/18 0737 09/02/18 1134  GLUCAP 142* 124* 140* 107* 120*   Lipid Profile: Recent Labs    09/26/2018 2257  TRIG 64   Thyroid Function Tests: No results for input(s): TSH, T4TOTAL, FREET4, T3FREE, THYROIDAB in the last 72 hours. Anemia Panel: Recent Labs    08/31/18 0730 09/01/18 0506  FERRITIN 39 50   Urine analysis: No results found for: COLORURINE, APPEARANCEUR, LABSPEC, PHURINE, GLUCOSEU, HGBUR, BILIRUBINUR, KETONESUR, PROTEINUR, UROBILINOGEN, NITRITE, LEUKOCYTESUR Sepsis Labs: Invalid input(s): PROCALCITONIN, LACTICIDVEN  Recent Results (from the past 240 hour(s))  SARS Coronavirus 2 (CEPHEID- Performed in [place] hospital lab), Hosp Order     Status: Abnormal   Collection Time: 08/24/18  7:46 AM   Specimen: Nasopharyngeal Swab  Result Value Ref  Range Status   SARS Coronavirus 2 POSITIVE (A) NEGATIVE Final    Comment: RESULT CALLED TO, READ BACK BY AND VERIFIED WITH: LEWIS,A. RN @1118  ON 6.8.2020 BY NMCCOY (NOTE) If result is NEGATIVE SARS-CoV-2 target nucleic acids are NOT DETECTED. The SARS-CoV-2 RNA is generally detectable in upper and lower  respiratory specimens during the acute phase of infection. The lowest  concentration of SARS-CoV-2 viral copies this assay can detect is 250  copies / mL. A negative result does not preclude SARS-CoV-2 infection  and should not be used as the sole basis for treatment or other  patient management decisions.  A negative result may occur with  improper specimen collection / handling, submission of specimen other  than nasopharyngeal swab, presence of viral mutation(s) within the  areas targeted by this assay, and inadequate number of viral copies  (<250 copies / mL). A negative result must be combined  with clinical  observations, patient history, and epidemiological information. If result is POSITIVE SARS-CoV-2 target nucleic acids are DETECTED . The SARS-CoV-2 RNA is generally detectable in upper and lower  respiratory specimens during the acute phase of infection.  Positive  results are indicative of active infection with SARS-CoV-2.  Clinical  correlation with patient history and other diagnostic information is  necessary to determine patient infection status.  Positive results do  not rule out bacterial infection or co-infection with other viruses. If result is PRESUMPTIVE POSTIVE SARS-CoV-2 nucleic acids MAY BE PRESENT.   A presumptive positive result was obtained on the submitted specimen  and confirmed on repeat testing.  While 2019 novel coronavirus  (SARS-CoV-2) nucleic acids may be present in the submitted sample  additional confirmatory testing may be necessary for epidemiological  and / or clinical management purposes  to differentiate between  SARS-CoV-2 and other  Sarbecovirus currently known to infect humans.  If clinically indicated additional testing with an alternate test  methodology [phone]([id number] ) is advised. The SARS-CoV-2 RNA is generally  detectable in upper and lower respiratory specimens during the acute  phase of infection. The expected result is Negative. Fact Sheet for Patients:  BoilerBrush.com.cyhttps://www.fda.gov/media/136312/download Fact Sheet for Healthcare Providers: https://pope.com/https://www.fda.gov/media/136313/download This test is not yet approved or cleared by the [place] FDA and has been authorized for detection and/or diagnosis of SARS-CoV-2 by FDA under an Emergency Use Authorization (EUA).  This EUA will remain in effect (meaning this test can be used) for the duration of the COVID-19 declaration under Section 564(b)(1) of the Act, 21 U.S.C. section 360bbb-3(b)(1), unless the authorization is terminated or revoked sooner. Performed at [facility], [address] [place], [place] [address]   Blood Culture (routine x 2)     Status: None (Preliminary result)   Collection Time: 08/30/18 11:02 PM   Specimen: BLOOD  Result Value Ref Range Status   Specimen Description   Final    BLOOD BLOOD RIGHT HAND Performed at [facility], [address] [place], [place] [address]    Special Requests   Final    BOTTLES DRAWN AEROBIC AND ANAEROBIC Blood Culture adequate volume Performed at [facility], [address] [place], [place] [address]    Culture   Final    NO GROWTH 2 DAYS Performed at [facility], [address] [place], [place] [address]    Report Status PENDING  Incomplete  Blood Culture (routine x 2)     Status: None (Preliminary result)   Collection Time: 08/30/18 11:05 PM   Specimen: BLOOD  Result Value Ref Range Status   Specimen Description   Final    BLOOD BLOOD LEFT HAND Performed at [facility], [address] [place], [place] [address]     Special Requests   Final    BOTTLES DRAWN AEROBIC AND ANAEROBIC Blood Culture results may not be optimal due to an inadequate volume of blood received in culture bottles Performed at [facility], [address] [place], [place] [address]    Culture   Final    NO GROWTH 2 DAYS Performed at [facility], [address] [place], [place] [address]    Report Status PENDING  Incomplete      Radiology Studies: No results found.  Huey Bienenstockawood Eldred Lievanos MD [facility]  Contact via  [email or web address]  [facility] Office Info P: [phone]  F: [phone]

## 2018-09-02 NOTE — Care Management (Signed)
CM spoke to [place] [phone]), nurse from [facility], with request for updated clinical notes. Patient is a Psychologist, clinical of the State, with updated progress notes requested to provide to the Medical Director at [facility]. CM faxed the requested progress notes to [phone] and will continue to follow and provide the progress notes as requested.  Midge Minium RN, BSN, NCM-BC, ACM-RN [phone]

## 2018-09-02 NOTE — Progress Notes (Signed)
PO intake today 2 1/3 of the magic cups 1 milk shake.

## 2018-09-02 NOTE — Evaluation (Signed)
Physical Therapy Evaluation Patient Details Name: Hannah Houston MRN: [id number] DOB: 11/21/1970 Today's Date: 09/02/2018   History of Present Illness  48 year old woman PMH autism was admitted 6/15 for acute hypoxic respiratory failure secondary to COVID-19 multifocal pneumonia  Clinical Impression  Pt was able to get up and walk around there room with two person min hand held assist for safety.  She is non-verbal, but seems to understand what we ask of her and pushes us away if she doesn't want something.  She like music and coloring.   PT to follow acutely for deficits listed below.      Follow Up Recommendations No PT follow up;Supervision for mobility/OOB    Equipment Recommendations  None recommended by PT    Recommendations for Other Services   NA    Precautions / Restrictions Precautions Precautions: Fall Precaution Comments: non-verbal Restrictions Weight Bearing Restrictions: No      Mobility  Bed Mobility Overal bed mobility: Needs Assistance Bed Mobility: Supine to Sit     Supine to sit: Min assist;+2 for safety/equipment     General bed mobility comments: Min assist to come to sitting EOB, multimodal cues for initiation.  Transfers Overall transfer level: Needs assistance Equipment used: 2 person hand held assist Transfers: Sit to/from Stand Sit to Stand: Min assist;+2 safety/equipment         General transfer comment: min two person hand held assist to come to standing from bed.    Ambulation/Gait Ambulation/Gait assistance: Min assist;+2 safety/equipment Gait Distance (Feet): 25 Feet Assistive device: 2 person hand held assist Gait Pattern/deviations: Step-through pattern(pigeon toed/internally rotated and hips and feet)     General Gait Details: Two person hand held assist to guide pt around the room to walk, attempted to take her to the bathroom, but she resisted, and we turned the other way and walked to the far side of the room and back to  the bed where she crawled into bed forward on hands and knees.          Balance Overall balance assessment: Needs assistance Sitting-balance support: Feet supported;No upper extremity supported Sitting balance-Leahy Scale: Good Sitting balance - Comments: Pt sits in bed on her knees with both feet tucked under her buttocks.     Standing balance support: Bilateral upper extremity supported Standing balance-Leahy Scale: Poor Standing balance comment: light bilateral hand held assist.  Second person for safety                              Pertinent Vitals/Pain Pain Assessment: Faces Faces Pain Scale: No hurt    Home Living Family/patient expects to be discharged to:: Group home                 Additional Comments: 2 steps to get in the door, one level once inside, tub shower with bench (sister thinks)    Prior Function Level of Independence: Needs assistance   Gait / Transfers Assistance Needed: no DME for gait  ADL's / Homemaking Assistance Needed: assist with most ADL (grooming/bathing/dressing)  Comments: [facility]        Extremity/Trunk Assessment   Upper Extremity Assessment Upper Extremity Assessment: Defer to OT evaluation    Lower Extremity Assessment Lower Extremity Assessment: RLE deficits/detail;LLE deficits/detail RLE Deficits / Details: bil LEs IR, Functionally strong enough to walk with light support.  no asymmetries R/L LLE Deficits / Details: bil LEs IR, Functionally strong enough  to walk with light support.  no asymmetries R/L    Cervical / Trunk Assessment Cervical / Trunk Assessment: Normal  Communication   Communication: Expressive difficulties(non-verbal)  Cognition Arousal/Alertness: Awake/alert Behavior During Therapy: WFL for tasks assessed/performed Overall Cognitive Status: History of cognitive impairments - at baseline                                                Assessment/Plan    PT Assessment Patient needs continued PT services     PT Treatment Interventions DME instruction;Gait training;Functional mobility training;Therapeutic activities;Therapeutic exercise;Balance training;Patient/family education    PT Goals (Current goals can be found in the Care Plan section)  Acute Rehab PT Goals Patient Stated Goal: unable to state, non verbal PT Goal Formulation: Patient unable to participate in goal setting Time For Goal Achievement: 09/16/18 Potential to Achieve Goals: Good    Frequency Min 3X/week   Barriers to discharge        Co-evaluation PT/OT/SLP Co-Evaluation/Treatment: Yes Reason for Co-Treatment: Complexity of the patient's impairments (multi-system involvement);For patient/therapist safety;Necessary to address cognition/behavior during functional activity;To address functional/ADL transfers PT goals addressed during session: Mobility/safety with mobility;Balance OT goals addressed during session: ADL's and self-care       AM-PAC PT "6 Clicks" Mobility  Outcome Measure Help needed turning from your back to your side while in a flat bed without using bedrails?: A Little Help needed moving from lying on your back to sitting on the side of a flat bed without using bedrails?: A Little Help needed moving to and from a bed to a chair (including a wheelchair)?: A Little Help needed standing up from a chair using your arms (e.g., wheelchair or bedside chair)?: A Little Help needed to walk in hospital room?: A Little Help needed climbing 3-5 steps with a railing? : A Little 6 Click Score: 18    End of Session   Activity Tolerance: Patient tolerated treatment well Patient left: in bed;with call bell/phone within reach;with restraints reapplied   PT Visit Diagnosis: Muscle weakness (generalized) (M62.81);Difficulty in walking, not elsewhere classified (R26.2)    Time: 0626-9485 PT Time Calculation (min) (ACUTE ONLY): 26  min   Charges:       Wells Guiles B. Johnnette Laux, PT, DPT  Acute Rehabilitation [phone] pager [phone]) [phone] office  @ [facility]: [phone]    PT Evaluation $PT Eval Moderate Complexity: 1 Mod        09/02/2018, 4:46 PM

## 2018-09-02 NOTE — TOC Initial Note (Addendum)
Transition of Care [facility]) - Initial/Assessment Note    Patient Details  Name: Hannah Houston MRN: [id number] Date of Birth: 04/10/1970  [facility]) CM/SW Contact:    Midge Minium RN, BSN, NCM-BC, ACM-RN [phone] (working remotely) Phone Number: 09/02/2018, 3:37 PM  Clinical Narrative:                 CM spoke to Renaye Rakers [phone]), nurse from [facility], with request for updated clinical notes. Patient is a Psychologist, clinical of the State, with updated progress notes requested to provide to the Medical Director at [facility]. CM faxed the requested progress notes to [phone], via Epic, and will continue to follow and provide the progress notes as requested. CM team will continue to follow.  Expected Discharge Plan: Group Home Barriers to Discharge: Continued Medical Work up   Patient Goals and CMS Choice     Choice offered to / list presented to : NA  Expected Discharge Plan and Services Expected Discharge Plan: Group Home In-house Referral: NA Discharge Planning Services: CM Consult Post Acute Care Choice: NA Living arrangements for the past 2 months: Group Home                 DME Arranged: N/A DME Agency: NA       HH Arranged: NA HH Agency: NA        Prior Living Arrangements/Services Living arrangements for the past 2 months: Group Home Lives with:: Facility Resident Patient language and need for interpreter reviewed:: Yes            Current home services: Other (comment)(Group Home) Criminal Activity/Legal Involvement Pertinent to Current Situation/Hospitalization: No - Comment as needed  Activities of Daily Living   ADL Screening (condition at time of admission) Patient's cognitive ability adequate to safely complete daily activities?: No Is the patient deaf or have difficulty hearing?: Yes Does the patient have difficulty seeing, even when wearing glasses/contacts?: No Does the patient have difficulty concentrating, remembering, or  making decisions?: Yes Patient able to express need for assistance with ADLs?: No Does the patient have difficulty dressing or bathing?: Yes Independently performs ADLs?: No Does the patient have difficulty walking or climbing stairs?: Yes  Permission Sought/Granted Permission sought to share information with : Case Manager(Patient is a ward of the state; resides at a group home with a legal guardian) Permission granted to share information with : Yes, Verbal Permission Granted              Emotional Assessment              Admission diagnosis:  COVID-19 [U07.1, J98.8] Pneumonia due to COVID-19 virus [U07.1, J12.89] Patient Active Problem List   Diagnosis Date Noted  . Pneumonia due to COVID-19 virus 08/31/2018  . Autism 08/31/2018  . Encounter for gynecological examination with Papanicolaou smear of cervix 08/06/2016   PCP:  Trinidad Curet Pharmacy:   [id number] - [place], [place] [address] [place] [address] Phone: [phone] Fax: [phone]     Social Determinants of Health (SDOH) Interventions    Readmission Risk Interventions No flowsheet data found.

## 2018-09-03 LAB — GLUCOSE, CAPILLARY
Glucose-Capillary: 156 mg/dL — ABNORMAL HIGH (ref 70–99)
Glucose-Capillary: 167 mg/dL — ABNORMAL HIGH (ref 70–99)
Glucose-Capillary: 129 mg/dL — ABNORMAL HIGH (ref 70–99)
Glucose-Capillary: 111 mg/dL — ABNORMAL HIGH (ref 70–99)
Glucose-Capillary: 127 mg/dL — ABNORMAL HIGH (ref 70–99)

## 2018-09-03 LAB — COMPREHENSIVE METABOLIC PANEL
ALT: 25 U/L (ref 0–44)
AST: 42 U/L — ABNORMAL HIGH (ref 15–41)
Albumin: 2.6 g/dL — ABNORMAL LOW (ref 3.5–5.0)
Alkaline Phosphatase: 72 U/L (ref 38–126)
Anion gap: 11 (ref 5–15)
BUN: 16 mg/dL (ref 6–20)
CO2: 26 mmol/L (ref 22–32)
Calcium: 8.1 mg/dL — ABNORMAL LOW (ref 8.9–10.3)
Chloride: 102 mmol/L (ref 98–111)
Creatinine, Ser: 0.48 mg/dL (ref 0.44–1.00)
GFR calc Af Amer: >60 mL/min (ref >60)
GFR calc non Af Amer: >60 mL/min (ref >60)
Glucose, Bld: 165 mg/dL — ABNORMAL HIGH (ref 70–99)
Potassium: 3.6 mmol/L (ref 3.5–5.1)
Sodium: 139 mmol/L (ref 135–145)
Total Bilirubin: 0.4 mg/dL (ref 0.3–1.2)
Total Protein: 5.9 g/dL — ABNORMAL LOW (ref 6.5–8.1)

## 2018-09-03 LAB — CBC WITH DIFFERENTIAL/PLATELET
Abs Immature Granulocytes: 0.09 10*3/uL — ABNORMAL HIGH (ref 0.00–0.07)
Basophils Absolute: 0 10*3/uL (ref 0.0–0.1)
Basophils Relative: 0 %
Eosinophils Absolute: 0 10*3/uL (ref 0.0–0.5)
Eosinophils Relative: 0 %
HCT: 35.8 % — ABNORMAL LOW (ref 36.0–46.0)
Hemoglobin: 12.5 g/dL (ref 12.0–15.0)
Immature Granulocytes: 1 %
Lymphocytes Relative: 11 %
Lymphs Abs: 0.9 10*3/uL (ref 0.7–4.0)
MCH: 31.3 pg (ref 26.0–34.0)
MCHC: 34.9 g/dL (ref 30.0–36.0)
MCV: 89.5 fL (ref 80.0–100.0)
Monocytes Absolute: 0.3 10*3/uL (ref 0.1–1.0)
Monocytes Relative: 4 %
Neutro Abs: 6.7 10*3/uL (ref 1.7–7.7)
Neutrophils Relative %: 84 %
Platelets: 247 10*3/uL (ref 150–400)
RBC: 4 MIL/uL (ref 3.87–5.11)
RDW: 12.4 % (ref 11.5–15.5)
WBC: 8 10*3/uL (ref 4.0–10.5)
nRBC: 0 % (ref 0.0–0.2)

## 2018-09-03 LAB — D-DIMER, QUANTITATIVE: D-Dimer, Quant: 0.51 ug/mL-FEU — ABNORMAL HIGH (ref 0.00–0.50)

## 2018-09-03 LAB — C-REACTIVE PROTEIN: CRP: <0.8 mg/dL (ref <1.0)

## 2018-09-03 MED ORDER — METHYLPREDNISOLONE SODIUM SUCC 40 MG IJ SOLR
30.0000 mg | Freq: Every day | INTRAMUSCULAR | Status: DC
  Administered 2018-09-04: 30 mg via INTRAVENOUS

## 2018-09-03 NOTE — Progress Notes (Signed)
PROGRESS NOTE  Hannah Houston ZOX:[id number] DOB: 02/05/1971 DOA: 08/30/2018 PCP: Lucretia Fieldoyals, [place] M   LOS: 3 days   Brief Narrative / Interim history: 48 year old female with history of autism was brought to the ER on August 24, 2018 due to fevers and cough, was diagnosed with COVID-19 and was discharged back to group home since she was not hypoxic.  Over the last several days she has becoming more lethargic, poor appetite, febrile and feels like her symptoms are getting worse and was brought to the ED and was admitted on 08/30/2018.  Subjective: -Nonverbal, not interactive.  No further requirement of restraints, she ambulated with with PT/OT yesterday  Assessment & Plan: Principal Problem:   Pneumonia due to COVID-19 virus Active Problems:   Autism   Acute Hypoxic Respiratory Failure due to Covid-19 Viral Illness -Acute hypoxic respiratory failure due to multifocal pneumonia in the setting of viral illness with COVID-19 -Patient was started on Remdesivir on 6/15, to finish tomorrow last dose -CRP normalized, D-dimers almost back to normal, which is reassuring . -Continue with IV Solu-Medrol, will start taper -Hypoxia seems to improve, she is 100% on room air this morning  COVID-19 Labs  Recent Labs    08/31/18 1410 09/01/18 0506 09/02/18 0732 09/03/18 0300  DDIMER  --  1.04* 0.57* 0.51*  FERRITIN  --  50  --   --   LDH 213* 245*  --   --   CRP  --  2.3* <0.8 <0.8    Lab Results  Component Value Date   SARSCOV2NAA POSITIVE (A) 08/24/2018    Active Problems Pancytopenia -Likely in the setting of COVID-19, resolved  History of autism -On multiple behavioral medications, resumed Seroquel, Depakote, Ativan.  -Discussed with brother, given her autism is very difficult for her to adjust to new environment, likely contributing to her poor oral intake during hospital stay, but she was encouraged by staff to increase her fluid intake, to increase her Ensure and Magic cups  intake .   Bradycardia - asymptomatic, significant arrhythmias, or pauses on telemetry .  Scheduled Meds: . enoxaparin (LOVENOX) injection  40 mg Subcutaneous Q24H  . methylPREDNISolone (SOLU-MEDROL) injection  30 mg Intravenous Q12H  . QUEtiapine  25 mg Oral BID   Continuous Infusions: . remdesivir 100 mg in NS 250 mL 500 mL/hr at 09/02/18 1600  . valproate sodium 250 mg (09/03/18 1040)   PRN Meds:.acetaminophen **OR** acetaminophen, LORazepam, ondansetron **OR** ondansetron (ZOFRAN) IV  DVT prophylaxis: Lovenox Code Status: Full code Family Communication: d/w brother Minerva Areolaric [phone] over the phone [phone], will call again today Disposition Plan: Back to group home in 1 to 2 days  Consultants:   None   Procedures:   None   Antimicrobials:  Remdesivir 6/15 >>   Objective: Vitals:   09/02/18 1915 09/03/18 0400 09/03/18 0456 09/03/18 0927  BP: 91/62 96/60  (!) 87/61  Pulse:    61  Resp:    12  Temp: 98.1 F (36.7 C) 98.5 F (36.9 C)  97.7 F (36.5 C)  TempSrc: Axillary Axillary  Oral  SpO2:    97%  Weight:   60.9 kg   Height:        Intake/Output Summary (Last 24 hours) at 09/03/2018 1104 Last data filed at 09/03/2018 0000 Gross per 24 hour  Intake 392.03 ml  Output -  Net 392.03 ml   Filed Weights   08/31/18 1225 09/01/18 0500 09/03/18 0456  Weight: 55 kg 56 kg 60.9 kg  Examination:  Wakes up to verbal stimuli, but noncommunicative, does not answer questions or follow command Symmetrical Chest wall movement, Good air movement bilaterally, CTAB RRR,No Gallops,Rubs or new Murmurs, No Parasternal Heave +ve B.Sounds, Abd Soft, No tenderness, No rebound - guarding or rigidity. No Cyanosis, Clubbing or edema, No new Rash or bruise      Data Reviewed: I have independently reviewed following labs and imaging studies    CBC: Recent Labs  Lab 08/30/18 2257 09/01/18 0506 09/02/18 0732 09/03/18 0300  WBC 3.0* 4.2 6.6 8.0  NEUTROABS 1.8  --  5.6 6.7   HGB 11.4* 12.0 11.9* 12.5  HCT 34.0* 36.3 35.7* 35.8*  MCV 93.7 92.6 91.5 89.5  PLT 113* 135* 173 247   Basic Metabolic Panel: Recent Labs  Lab 08/30/18 2257 09/01/18 0506 09/03/18 0300  NA 139 141 139  K 3.2* 3.7 3.6  CL 103 107 102  CO2 27 22 26   GLUCOSE 104* 113* 165*  BUN 19 12 16   CREATININE 0.67 0.45 0.48  CALCIUM 8.0* 7.4* 8.1*   GFR: Estimated Creatinine Clearance: 63.9 mL/min (by C-G formula based on SCr of 0.48 mg/dL). Liver Function Tests: Recent Labs  Lab 08/30/18 2257 08/31/18 1410 09/01/18 0506 09/03/18 0300  AST 24 23 21  42*  ALT 17 14 14 25   ALKPHOS 73 68 62 72  BILITOT 0.1* 0.5 0.5 0.4  PROT 6.3* 6.1* 5.7* 5.9*  ALBUMIN 3.0* 2.9* 2.5* 2.6*   No results for input(s): LIPASE, AMYLASE in the last 168 hours. Recent Labs  Lab 08/31/18 0730  AMMONIA 32   Coagulation Profile: No results for input(s): INR, PROTIME in the last 168 hours. Cardiac Enzymes: Recent Labs  Lab 08/31/18 1410  TROPONINI <0.03   BNP (last 3 results) No results for input(s): PROBNP in the last 8760 hours. HbA1C: No results for input(s): HGBA1C in the last 72 hours. CBG: Recent Labs  Lab 09/02/18 1615 09/02/18 2028 09/02/18 2348 09/03/18 0449 09/03/18 0924  GLUCAP 186* 210* 167* 156* 129*   Lipid Profile: No results for input(s): CHOL, HDL, LDLCALC, TRIG, CHOLHDL, LDLDIRECT in the last 72 hours. Thyroid Function Tests: No results for input(s): TSH, T4TOTAL, FREET4, T3FREE, THYROIDAB in the last 72 hours. Anemia Panel: Recent Labs    09/01/18 0506  FERRITIN 50   Urine analysis: No results found for: COLORURINE, APPEARANCEUR, LABSPEC, PHURINE, GLUCOSEU, HGBUR, BILIRUBINUR, KETONESUR, PROTEINUR, UROBILINOGEN, NITRITE, LEUKOCYTESUR Sepsis Labs: Invalid input(s): PROCALCITONIN, LACTICIDVEN  Recent Results (from the past 240 hour(s))  Blood Culture (routine x 2)     Status: None (Preliminary result)   Collection Time: 08/30/18 11:02 PM   Specimen: BLOOD   Result Value Ref Range Status   Specimen Description   Final    BLOOD BLOOD RIGHT HAND Performed at [facility], [address] [place], [place] [address]    Special Requests   Final    BOTTLES DRAWN AEROBIC AND ANAEROBIC Blood Culture adequate volume Performed at [facility], [address] [place], [place] [address]    Culture   Final    NO GROWTH 2 DAYS Performed at [facility], [address] [place], [place] [address]    Report Status PENDING  Incomplete  Blood Culture (routine x 2)     Status: None (Preliminary result)   Collection Time: 08/30/18 11:05 PM   Specimen: BLOOD  Result Value Ref Range Status   Specimen Description   Final    BLOOD BLOOD LEFT HAND  Performed at [facility], [place] [address] [place], [place] [address]    Special Requests   Final    BOTTLES DRAWN AEROBIC AND ANAEROBIC Blood Culture results may not be optimal due to an inadequate volume of blood received in culture bottles Performed at [facility] [address], [place], [place] [address]    Culture   Final    NO GROWTH 2 DAYS Performed at [facility] [address] [place], [place] [address]    Report Status PENDING  Incomplete      Radiology Studies: No results found.  Phillips Climes MD [facility]  Contact via  [email or web address]  [place] P: [phone]  F: [phone]

## 2018-09-03 NOTE — Progress Notes (Signed)
During rounds with MD this morning we discussed pts poor po intake and made plan with team to offer fluids frequently. Pt putting hand over mouth or pushing cup away. Pt has had zero liquids, as well as no food this morning due to refusal. Vital signs remain stable at this time. MD paged and made aware. Will continue to closely monitor vital signs, and continue to offer fluids frequently.

## 2018-09-03 NOTE — Progress Notes (Signed)
Physical Therapy Treatment Patient Details Name: Hannah Houston MRN: [id number] DOB: 09/09/1970 Today's Date: 09/03/2018    History of Present Illness 48 year old woman PMH autism was admitted 6/15 for acute hypoxic respiratory failure secondary to COVID-19 multifocal pneumonia    PT Comments    Patient pleasant and cooperative with gentle guiding facilitation to come to stand at EOB. Patient then incontinent of large volume urine. Repeated sit to stand x 3 while changing gown and linens and cleaning pt. She required very little assistance (mostly guiding). Returned to supine and unsure pt would stay in the chair without trying to get up alone (due to decr cognition).     Follow Up Recommendations  No PT follow up;Supervision for mobility/OOB     Equipment Recommendations  None recommended by PT    Recommendations for Other Services       Precautions / Restrictions Precautions Precautions: Fall Precaution Comments: non-verbal Restrictions Weight Bearing Restrictions: No    Mobility  Bed Mobility Overal bed mobility: Needs Assistance Bed Mobility: Supine to Sit;Sit to Supine     Supine to sit: Min assist Sit to supine: Max assist   General bed mobility comments: Min assist to come to sitting EOB, multimodal cues for initiation.; to supine pt seemed to not understand and incr assist needed  Transfers Overall transfer level: Needs assistance Equipment used: 1 person hand held assist Transfers: Sit to/from Stand Sit to Stand: Min assist         General transfer comment: min facilitation to understand task; stood 3x for linen change/clean up  Ambulation/Gait             General Gait Details: pt incontinent of bladder once standing   Stairs             Wheelchair Mobility    Modified Rankin (Stroke Patients Only)       Balance Overall balance assessment: Needs assistance Sitting-balance support: Feet supported;No upper extremity  supported Sitting balance-Leahy Scale: Good Sitting balance - Comments: Pt sits in bed on her knees with both feet tucked under her buttocks.     Standing balance support: Single extremity supported Standing balance-Leahy Scale: Poor                              Cognition Arousal/Alertness: Awake/alert Behavior During Therapy: WFL for tasks assessed/performed Overall Cognitive Status: History of cognitive impairments - at baseline                                        Exercises      General Comments        Pertinent Vitals/Pain Faces Pain Scale: No hurt    Home Living Family/patient expects to be discharged to:: Group home               Additional Comments: 2 steps to get in the door, one level once inside, tub shower with bench (sister thinks)    Prior Function Level of Independence: Needs assistance  Gait / Transfers Assistance Needed: no DME for gait ADL's / Homemaking Assistance Needed: assist with most ADL (grooming/bathing/dressing) Comments: information from sister Glenard Haring   PT Goals (current goals can now be found in the care plan section) Acute Rehab PT Goals Patient Stated Goal: unable to state, non verbal Time For Goal Achievement: 09/16/18    Frequency  Min 3X/week      PT Plan Current plan remains appropriate    Co-evaluation              AM-PAC PT "6 Clicks" Mobility   Outcome Measure  Help needed turning from your back to your side while in a flat bed without using bedrails?: A Little Help needed moving from lying on your back to sitting on the side of a flat bed without using bedrails?: A Little Help needed moving to and from a bed to a chair (including a wheelchair)?: A Little Help needed standing up from a chair using your arms (e.g., wheelchair or bedside chair)?: A Little Help needed to walk in hospital room?: A Little Help needed climbing 3-5 steps with a railing? : A Little 6 Click Score: 18     End of Session Equipment Utilized During Treatment: Oxygen Activity Tolerance: Patient tolerated treatment well Patient left: in bed;with call bell/phone within reach(no restraints on arrival; left the same) Nurse Communication: Other (comment)(NT told re: soaked bed) PT Visit Diagnosis: Muscle weakness (generalized) (M62.81);Difficulty in walking, not elsewhere classified (R26.2)     Time: [id number] PT Time Calculation (min) (ACUTE ONLY): 36 min  Charges:  $Therapeutic Activity: 23-37 mins                        [employer]ynn P Daymond Cordts, PT 09/03/2018, 4:38 PM

## 2018-09-04 LAB — GLUCOSE, CAPILLARY
Glucose-Capillary: 104 mg/dL — ABNORMAL HIGH (ref 70–99)
Glucose-Capillary: 116 mg/dL — ABNORMAL HIGH (ref 70–99)
Glucose-Capillary: 92 mg/dL (ref 70–99)
Glucose-Capillary: 94 mg/dL (ref 70–99)
Glucose-Capillary: 96 mg/dL (ref 70–99)

## 2018-09-04 MED ORDER — SODIUM CHLORIDE 0.9 % IV SOLN
INTRAVENOUS | Status: DC | PRN
  Administered 2018-09-04: 02:00:00 via INTRAVENOUS

## 2018-09-04 MED ORDER — PREDNISONE 10 MG PO TABS: 20.0000 mg | ORAL_TABLET | Freq: Every day | ORAL | 0 refills | Status: AC

## 2018-09-04 MED ORDER — KCL IN DEXTROSE-NACL 10-5-0.45 MEQ/L-%-% IV SOLN
INTRAVENOUS | Status: DC
  Filled 2018-09-04: qty 1000

## 2018-09-04 MED ORDER — LORAZEPAM 1 MG PO TABS: 1.0000 mg | ORAL_TABLET | Freq: Three times a day (TID) | ORAL | 0 refills | Status: AC

## 2018-09-04 NOTE — Progress Notes (Signed)
Called report to [place], the on call nurse at the group home where pt resides. I informed her that Corey Harold has not picked pt up and that I would call to inform her when [facility] has picked up the pt to return to her the group home. Gave on call nurse Anderson Malta my direct number to call with any further questions that she may have. I emphasized pts refusal to eat or drink X 2 days. We also discussed that the goal is for pt to have improved PO intake once back in her home environment. Legal guardian Glenard Haring also notified of pts dc back to group home. I relayed to on call nurse that Glenard Haring is requesting update from the group home tomorrow morning. Discussed content of AVS AVS, verbalized understanding and gratitude.

## 2018-09-04 NOTE — Discharge Instructions (Signed)
Person Under Monitoring Name: Hannah Houston  Location: [place] [address]   Infection Prevention Recommendations for Individuals Confirmed to have, or Being Evaluated for, 2019 Novel Coronavirus (COVID-19) Infection Who Receive Care at Home  Individuals who are confirmed to have, or are being evaluated for, COVID-19 should follow the prevention steps below until a healthcare provider or local or state health department says they can return to normal activities.  Stay home except to get medical care You should restrict activities outside your home, except for getting medical care. Do not go to work, school, or public areas, and do not use public transportation or taxis.  Call ahead before visiting your doctor Before your medical appointment, call the healthcare provider and tell them that you have, or are being evaluated for, COVID-19 infection. This will help the healthcare providers office take steps to keep other people from getting infected. Ask your healthcare provider to call the local or state health department.  Monitor your symptoms Seek prompt medical attention if your illness is worsening (e.g., difficulty breathing). Before going to your medical appointment, call the healthcare provider and tell them that you have, or are being evaluated for, COVID-19 infection. Ask your healthcare provider to call the local or state health department.  Wear a facemask You should wear a facemask that covers your nose and mouth when you are in the same room with other people and when you visit a healthcare provider. People who live with or visit you should also wear a facemask while they are in the same room with you.  Separate yourself from other people in your home As much as possible, you should stay in a different room from other people in your home. Also, you should use a separate bathroom, if available.  Avoid sharing household  items You should not share dishes, drinking glasses, cups, eating utensils, towels, bedding, or other items with other people in your home. After using these items, you should wash them thoroughly with soap and water.  Cover your coughs and sneezes Cover your mouth and nose with a tissue when you cough or sneeze, or you can cough or sneeze into your sleeve. Throw used tissues in a lined trash can, and immediately wash your hands with soap and water for at least 20 seconds or use an alcohol-based hand rub.  Wash your [facility] your hands often and thoroughly with soap and water for at least 20 seconds. You can use an alcohol-based hand sanitizer if soap and water are not available and if your hands are not visibly dirty. Avoid touching your eyes, nose, and mouth with unwashed hands.   Prevention Steps for Caregivers and Household Members of Individuals Confirmed to have, or Being Evaluated for, COVID-19 Infection Being Cared for in the Home  If you live with, or provide care at home for, a person confirmed to have, or being evaluated for, COVID-19 infection please follow these guidelines to prevent infection:  Follow healthcare providers instructions Make sure that you understand and can help the patient follow any healthcare provider instructions for all care.  Provide for the patients basic needs You should help the patient with basic needs in the home and provide support for getting groceries, prescriptions, and other personal needs.  Monitor the patients symptoms If they are getting sicker, call his or her medical provider and tell them that the patient has, or is being evaluated for, COVID-19 infection. This will help the  healthcare providers office take steps to keep other people from getting infected. Ask the healthcare provider to call the local or state health department.  Limit the number of people who have contact with the patient  If possible, have only one  caregiver for the patient.  Other household members should stay in another home or place of residence. If this is not possible, they should stay  in another room, or be separated from the patient as much as possible. Use a separate bathroom, if available.  Restrict visitors who do not have an essential need to be in the home.  Keep older adults, very young children, and other sick people away from the patient Keep older adults, very young children, and those who have compromised immune systems or chronic health conditions away from the patient. This includes people with chronic heart, lung, or kidney conditions, diabetes, and cancer.  Ensure good ventilation Make sure that shared spaces in the home have good air flow, such as from an air conditioner or an opened window, weather permitting.  Wash your hands often  Wash your hands often and thoroughly with soap and water for at least 20 seconds. You can use an alcohol based hand sanitizer if soap and water are not available and if your hands are not visibly dirty.  Avoid touching your eyes, nose, and mouth with unwashed hands.  Use disposable paper towels to dry your hands. If not available, use dedicated cloth towels and replace them when they become wet.  Wear a facemask and gloves  Wear a disposable facemask at all times in the room and gloves when you touch or have contact with the patients blood, body fluids, and/or secretions or excretions, such as sweat, saliva, sputum, nasal mucus, vomit, urine, or feces.  Ensure the mask fits over your nose and mouth tightly, and do not touch it during use.  Throw out disposable facemasks and gloves after using them. Do not reuse.  Wash your hands immediately after removing your facemask and gloves.  If your personal clothing becomes contaminated, carefully remove clothing and launder. Wash your hands after handling contaminated clothing.  Place all used disposable facemasks, gloves, and  other waste in a lined container before disposing them with other household waste.  Remove gloves and wash your hands immediately after handling these items.  Do not share dishes, glasses, or other household items with the patient  Avoid sharing household items. You should not share dishes, drinking glasses, cups, eating utensils, towels, bedding, or other items with a patient who is confirmed to have, or being evaluated for, COVID-19 infection.  After the person uses these items, you should wash them thoroughly with soap and water.  Wash laundry thoroughly  Immediately remove and wash clothes or bedding that have blood, body fluids, and/or secretions or excretions, such as sweat, saliva, sputum, nasal mucus, vomit, urine, or feces, on them.  Wear gloves when handling laundry from the patient.  Read and follow directions on labels of laundry or clothing items and detergent. In general, wash and dry with the warmest temperatures recommended on the label.  Clean all areas the individual has used often  Clean all touchable surfaces, such as counters, tabletops, doorknobs, bathroom fixtures, toilets, phones, keyboards, tablets, and bedside tables, every day. Also, clean any surfaces that may have blood, body fluids, and/or secretions or excretions on them.  Wear gloves when cleaning surfaces the patient has come in contact with.  Use a diluted bleach solution (e.g., dilute bleach  with 1 part bleach and 10 parts water) or a household disinfectant with a label that says EPA-registered for coronaviruses. To make a bleach solution at home, add 1 tablespoon of bleach to 1 quart (4 cups) of water. For a larger supply, add  cup of bleach to 1 gallon (16 cups) of water.  Read labels of cleaning products and follow recommendations provided on product labels. Labels contain instructions for safe and effective use of the cleaning product including precautions you should take when applying the product,  such as wearing gloves or eye protection and making sure you have good ventilation during use of the product.  Remove gloves and wash hands immediately after cleaning.  Monitor yourself for signs and symptoms of illness Caregivers and household members are considered close contacts, should monitor their health, and will be asked to limit movement outside of the home to the extent possible. Follow the monitoring steps for close contacts listed on the symptom monitoring form.   ? If you have additional questions, contact your local health department or call the epidemiologist on call at [phone] (available 24/7). ? This guidance is subject to change. For the most up-to-date guidance from [facility], please refer to their website: YouBlogs.pl

## 2018-09-04 NOTE — Progress Notes (Signed)
Patient refusing anything by mouth including mouth care, food & drink. Will continue to monitor.

## 2018-09-04 NOTE — TOC Transition Note (Signed)
Transition of Care [facility]) - CM/SW Discharge Note   Patient Details  Name: Hannah Houston MRN: [id number] Date of Birth: [birth year]  [facility] CM/SW Contact:  Geralynn Ochs, LCSW Phone Number: 09/04/2018, 4:21 PM   Clinical Narrative:  Nurse to call report to:   IF BEFORE 5: [phone], ask for Talacia   IF AFTER 5: [phone] (On call nurse)    Final next level of care: Group Home Barriers to Discharge: Barriers Resolved   Patient Goals and CMS Choice     Choice offered to / list presented to : NA  Discharge Placement              Patient chooses bed at: ([facility]) Patient to be transferred to facility by: [place] Name of family member notified: Talacia Patient and family notified of of transfer: 09/04/18  Discharge Plan and Services In-house Referral: NA Discharge Planning Services: CM Consult Post Acute Care Choice: NA          DME Arranged: N/A DME Agency: NA       HH Arranged: NA HH Agency: NA        Social Determinants of Health (SDOH) Interventions     Readmission Risk Interventions No flowsheet data found.

## 2018-09-04 NOTE — TOC Progression Note (Signed)
Transition of Care [facility]) - Progression Note    Patient Details  Name: Hannah Houston MRN: [id number] Date of Birth: November 02, 1970  Transition of Care [facility]) CM/SW [place], [place] Phone Number: 09/04/2018, 12:31 PM  Clinical Narrative:  CSW following for discharge plans. CSW contacted by MD about patient possible return to [facility] group home today. CSW reached out to [place] to discuss patient's return, and the medical director is on a conference call right now to determine if patient can return. CSW discussed concerns about patient not adjusting to the hospital environment and refusing to eat, that she will likely do better once transitioned back to her home environment due to her autism. Talacia indicated understanding and will pass that on to the medical team at group home. Talacia to call CSW back after determination is made.      Expected Discharge Plan: Group Home Barriers to Discharge: Continued Medical Work up  Expected Discharge Plan and Services Expected Discharge Plan: Group Home In-house Referral: NA Discharge Planning Services: CM Consult Post Acute Care Choice: NA Living arrangements for the past 2 months: Group Home                 DME Arranged: N/A DME Agency: NA       HH Arranged: NA HH Agency: NA         Social Determinants of Health (SDOH) Interventions    Readmission Risk Interventions No flowsheet data found.

## 2018-09-04 NOTE — Discharge Summary (Signed)
Hannah Houston, is a 48 y.o. female  DOB 08/14/1970  MRN [id number].  Admission date:  08/30/2018  Admitting Physician  Eduard ClosArshad N Kakrakandy, MD  Discharge Date:  09/04/2018   Primary MD  Royals, Gretta BeganHoover M  Recommendations for primary care physician for things to follow:  -Please check CBC, BMP, LFTs in 1 week -Please continue to encourage oral intake, offer her multiple options, as well encouraged to increase fluid intake   Admission Diagnosis  COVID-19 [U07.1, J98.8] Pneumonia due to COVID-19 virus [U07.1, J12.89]   Discharge Diagnosis  COVID-19 [U07.1, J98.8] Pneumonia due to COVID-19 virus [U07.1, J12.89]    Principal Problem:   Pneumonia due to COVID-19 virus Active Problems:   Autism      Past Medical History:  Diagnosis Date  . Anemia   . Gastritis   . GERD (gastroesophageal reflux disease)   . Intellectual disability   . Iron deficiency   . Mental retardation   . Non-verbal learning disorder   . Poor posture    walks bent over  . Urine incontinence    occassional, uses depends    Past Surgical History:  Procedure Laterality Date  . DENTAL RESTORATION/EXTRACTION WITH X-RAY N/A 05/06/2014   Procedure: CLEANING XRAY, DENTAL RESTORATION AND EXTRACTIONS;  Surgeon: Esaw DaceWilliam E Milner Jr., DDS;  Location: [facility] OR;  Service: Oral Surgery;  Laterality: N/A;  Recall Exam, Full mouth xrays and full mouth debridement,  Glass ionomers - #2 OF; #3 OL; #4 O; #6 F; #12 O; #13 O; #14 OFL; #20 DO Extractions - 12 Prime - #14, 15, 18, 19, 23, 27, 28, 29, 30, 31  . DENTAL RESTORATION/EXTRACTION WITH X-RAY N/A 06/15/2015   Procedure: DENTAL RESTORATION; EXTRACTION OF TEETH NUMBER TWO AND EIGHTEEN WITH X-RAY and cleaning;  Surgeon: Boneta LucksWilliam Milner, DDS;  Location: [facility] OR;  Service: Oral Surgery;  Laterality: N/A;  . NO PAST SURGERIES         History of present illness and  Hospital Course:      Kindly see H&P for history of present illness and admission details, please review complete Labs, Consult reports and Test reports for all details in brief  HPI  from the history and physical done on the day of admission 08/31/2018  HPI: Hannah CoverCrystal D Kaeding is a 48 y.o. female with history of autism was brought to the ER with fever and cough on August 24, 2018 at that time patient was diagnosed with COVID-19 and was discharged back to group home.  Since then as per the caregiver patient has become more lethargic and has been having poor appetite.  Patient has become more febrile.  Given the worsening symptoms [facility]  ED Course: On exam patient appears mildly lethargic but easily arousable.  Does not follow commands.  Had a fever of 99.3 in the ER blood pressure initially was in the systolic 80s for which patient was given 2 L fluid bolus.  Labs show potassium of 3.2 creatinine 1.6 WBC count of  3 hemoglobin 11.4 platelets 113 CRP 1.6 procalcitonin less than 0.1 and chest x-ray shows multifocal basilar pneumonia consistent with COVID-19.  D-dimer was 0.5 and fibrinogen was 479.  Hospital Course   Acute Hypoxic Respiratory Failure due to Covid-19 Viral Illness -Acute hypoxic respiratory failure due to multifocal pneumonia in the setting of viral illness with COVID-19 -Patient was started on Remdesivir on 6/15,  finished total of 5 days treatment -CRP normalized, D-dimers almost back to normal, which is reassuring . -Rated with IV Solu-Medrol during hospital stay, to finish another 3 days of prednisone as an outpatient -Hypoxia has resolved, no hypoxia over last 3 days  COVID-19 Labs  Recent Labs (last 2 labs)         Recent Labs    08/31/18 1410 09/01/18 0506 09/02/18 0732 09/03/18 0300  DDIMER  --  1.04* 0.57* 0.51*  FERRITIN  --  50  --   --   LDH 213* 245*  --   --   CRP  --  2.3* <0.8 <0.8      Recent Labs       Lab Results  Component  Value Date   SARSCOV2NAA POSITIVE (A) 08/24/2018      Active Problems Pancytopenia -Likely in the setting of COVID-19, resolved  History of autism -On multiple behavioral medications, resumed Seroquel, Depakote, Ativan.  -Discussed with brother, given her autism is very difficult for her to adjust to new environment, likely contributing to her poor oral intake during hospital stay, but she was encouraged by staff to increase her fluid intake, to increase her Ensure and Magic cups intake .   Bradycardia - asymptomatic, no significant arrhythmias,AV block  or pauses on telemetry .    Discharge Condition: stable   Follow UP  Follow-up Information    Royals, Gretta BeganHoover M Follow up in 1 week(s).   Specialty: Family Medicine Contact information: [address] [place] [address] [phone]             Discharge Instructions  and  Discharge Medications    Discharge Instructions    Discharge instructions   Complete by: As directed    Follow with Primary MD Royals, Gretta BeganHoover M in 7 days   Get CBC, CMP,  checked  by Primary MD next visit.    Activity: As tolerated with Full fall precautions use walker/cane & assistance as needed   Disposition Home    Diet: Regular diet, continue to encourage patient to improve her oral intake, and improve her fluid intake as well.   On your next visit with your primary care physician please Get Medicines reviewed and adjusted.   Please request your Prim.MD to go over all Hospital Tests and Procedure/Radiological results at the follow up, please get all Hospital records sent to your Prim MD by signing hospital release before you go home.   If you experience worsening of your admission symptoms, develop shortness of breath, life threatening emergency, suicidal or homicidal thoughts you must seek medical attention immediately by calling 911 or calling your MD immediately  if symptoms less severe.  You Must read complete  instructions/literature along with all the possible adverse reactions/side effects for all the Medicines you take and that have been prescribed to you. Take any new Medicines after you have completely understood and accpet all the possible adverse reactions/side effects.   Do not drive, operating heavy machinery, perform activities at heights, swimming or participation in water activities or provide baby sitting services if your were admitted  for syncope or siezures until you have seen by Primary MD or a Neurologist and advised to do so again.  Do not drive when taking Pain medications.    Do not take more than prescribed Pain, Sleep and Anxiety Medications  Special Instructions: If you have smoked or chewed Tobacco  in the last 2 yrs please stop smoking, stop any regular Alcohol  and or any Recreational drug use.  Wear Seat belts while driving.   Please note  You were cared for by a hospitalist during your hospital stay. If you have any questions about your discharge medications or the care you received while you were in the hospital after you are discharged, you can call the unit and asked to speak with the hospitalist on call if the hospitalist that took care of you is not available. Once you are discharged, your primary care physician will handle any further medical issues. Please note that NO REFILLS for any discharge medications will be authorized once you are discharged, as it is imperative that you return to your primary care physician (or establish a relationship with a primary care physician if you do not have one) for your aftercare needs so that they can reassess your need for medications and monitor your lab values.   Increase activity slowly   Complete by: As directed      Allergies as of 09/04/2018      Reactions   Codeine Other (See Comments)   Unknown      Medication List    TAKE these medications   acetaminophen 325 MG tablet Commonly known as: TYLENOL Take 650 mg by  mouth every 4 (four) hours as needed for mild pain, moderate pain, fever or headache. And 3 times daily from 9/10-9/13   cholecalciferol 1000 units tablet Commonly known as: VITAMIN D Take 1,000 Units by mouth daily.   divalproex 125 MG capsule Commonly known as: DEPAKOTE SPRINKLE Take 250 mg by mouth 2 (two) times daily.   DULoxetine 60 MG capsule Commonly known as: CYMBALTA Take 60 mg by mouth daily.   LORazepam 2 MG tablet Commonly known as: ATIVAN Take 2 mg by mouth. 30 minutes prior to procedure What changed: Another medication with the same name was changed. Make sure you understand how and when to take each.   LORazepam 1 MG tablet Commonly known as: ATIVAN Take 1-2 tablets (1-2 mg total) by mouth 3 (three) times daily. Takes 1 mg in the morning at 8:00 am, 1 mg in the afternoon at 1400 and 1 mg in the evening 2000 And 1 tablet 30 minutes prior to procedure What changed: additional instructions   Melatonin 3 MG Caps Take 3 mg by mouth at bedtime.   predniSONE 10 MG tablet Commonly known as: DELTASONE Take 2 tablets (20 mg total) by mouth daily for 3 days. Take for 3 days then stop   QUEtiapine 25 MG tablet Commonly known as: SEROQUEL Take 25-50 mg by mouth See admin instructions. 25 mg in the morning and 50 mg at bedtime   senna 8.6 MG Tabs tablet Commonly known as: SENOKOT Take 1 tablet by mouth at bedtime.   vitamin A & D ointment Apply 1 application topically daily. To lower legs and arms daily   vitamin B-12 500 MCG tablet Commonly known as: CYANOCOBALAMIN Take 500 mcg by mouth daily.         Diet and Activity recommendation: See Discharge Instructions above   Consults obtained -  None  Major procedures  and Radiology Reports - PLEASE review detailed and final reports for all details, in brief -      Dg Chest Port 1 View  Result Date: 08/30/2018 CLINICAL DATA:  Hypotensive. Lethargy. COVID positive. EXAM: PORTABLE CHEST 1 VIEW COMPARISON:   None. FINDINGS: Patchy bibasilar airspace disease, right greater than left. Lesser patchy opacities in the right mid and upper lung zones. Lung volumes are low. Upper normal heart size likely accentuated by technique and low lung volumes. No evidence of pleural effusion or pneumothorax. Remote midshaft right clavicle fracture. IMPRESSION: Multifocal bibasilar predominant airspace disease consistent with atypical viral pneumonia in this patient with known COVID infection. Electronically Signed   By: Narda Rutherford M.D.   On: 08/30/2018 23:45    Micro Results     Recent Results (from the past 240 hour(s))  Blood Culture (routine x 2)     Status: None (Preliminary result)   Collection Time: 08/30/18 11:02 PM   Specimen: BLOOD  Result Value Ref Range Status   Specimen Description   Final    BLOOD BLOOD RIGHT HAND Performed at [facility], [address] [place], [place] [address]    Special Requests   Final    BOTTLES DRAWN AEROBIC AND ANAEROBIC Blood Culture adequate volume Performed at [facility], [address], [place], [place] [address]    Culture   Final    NO GROWTH 4 DAYS Performed at [facility], [address], [place], [place] [address]    Report Status PENDING  Incomplete  Blood Culture (routine x 2)     Status: None (Preliminary result)   Collection Time: 08/30/18 11:05 PM   Specimen: BLOOD  Result Value Ref Range Status   Specimen Description   Final    BLOOD BLOOD LEFT HAND Performed at [facility], [address] [place], [place] [address]    Special Requests   Final    BOTTLES DRAWN AEROBIC AND ANAEROBIC Blood Culture results may not be optimal due to an inadequate volume of blood received in culture bottles Performed at [facility], [address], [place], [place] [address]    Culture   Final    NO GROWTH 4 DAYS Performed at [facility], [address],  [place], [place] [address]    Report Status PENDING  Incomplete       Today   Subjective:   Adda Beswick no significant events overnight as discussed with staff .  Objective:   Blood pressure 95/78, pulse 67, temperature 97.7 F (36.5 C), temperature source Axillary, resp. rate 17, height  (1.422 m), weight 60.9 kg, SpO2 97 %.   Intake/Output Summary (Last 24 hours) at 09/04/2018 1547 Last data filed at 09/04/2018 0749 Gross per 24 hour  Intake 70 ml  Output -  Net 70 ml    Exam She is awake, but noncommunicative, does not follow commands at baseline Symmetrical Chest wall movement, Good air movement bilaterally, CTAB RRR,No Gallops,Rubs or new Murmurs, No Parasternal Heave +ve B.Sounds, Abd Soft, Non tender, No rebound -guarding or rigidity. No Cyanosis, Clubbing or edema, No new Rash or bruise  Data Review   CBC w Diff:  Lab Results  Component Value Date   WBC 8.0 09/03/2018   HGB 12.5 09/03/2018   HCT 35.8 (L) 09/03/2018   PLT 247 09/03/2018   LYMPHOPCT 11 09/03/2018   MONOPCT 4 09/03/2018   EOSPCT 0 09/03/2018  BASOPCT 0 09/03/2018    CMP:  Lab Results  Component Value Date   NA 139 09/03/2018   K 3.6 09/03/2018   CL 102 09/03/2018   CO2 26 09/03/2018   BUN 16 09/03/2018   CREATININE 0.48 09/03/2018   PROT 5.9 (L) 09/03/2018   ALBUMIN 2.6 (L) 09/03/2018   BILITOT 0.4 09/03/2018   ALKPHOS 72 09/03/2018   AST 42 (H) 09/03/2018   ALT 25 09/03/2018  .   Total Time in preparing paper work, data evaluation and todays exam - 63 minutes  Phillips Climes M.D on 09/04/2018 at 3:47 PM  [facility]   Office  [phone]

## 2018-09-05 LAB — CULTURE, BLOOD (ROUTINE X 2)
Culture: NO GROWTH
Culture: NO GROWTH
Special Requests: ADEQUATE

## 2019-03-29 ENCOUNTER — Telehealth: Payer: Self-pay | Admitting: Family Medicine

## 2019-03-29 ENCOUNTER — Ambulatory Visit: Payer: Self-pay | Admitting: Dentistry

## 2019-03-29 DIAGNOSIS — Z79899 Other long term (current) drug therapy: Secondary | ICD-10-CM

## 2019-03-29 DIAGNOSIS — D509 Iron deficiency anemia, unspecified: Secondary | ICD-10-CM

## 2019-03-29 DIAGNOSIS — Z791 Long term (current) use of non-steroidal anti-inflammatories (NSAID): Secondary | ICD-10-CM

## 2019-03-29 NOTE — Telephone Encounter (Signed)
[facility] [phone]) called in regards to this patient wanting to speak to Dr. Shawnie Pons about routine care for this patient.  Would like to speak to a nurse about this patient and touch basis with Dr. Francesco Runner.. Instructed that a nurse will be giving her a call back.

## 2019-03-29 NOTE — Telephone Encounter (Signed)
Returned call to [facility] and I was asked if Dr. Shawnie Pons would be available to collaborate with their dentist to do a pap smear while pt is under anesthesia for surgery on 04/01/19.  Called Dr. Vergie Living who recommended that I call Rhea Bleacher so that she can see what surgeons are available to possibly assist with the dentist on 04/01/19.  Gave the [place], number to [place] and recommended that she call her to possibly schedule a Research officer, trade union.  She voiced understanding.   Addison Naegeli, RN 03/29/19

## 2019-03-31 ENCOUNTER — Other Ambulatory Visit: Payer: Self-pay

## 2019-03-31 ENCOUNTER — Encounter (HOSPITAL_COMMUNITY): Payer: Self-pay | Admitting: *Deleted

## 2019-03-31 NOTE — Progress Notes (Signed)
Anesthesia Chart Review: SAME DAY WORK-UP   Case: [id number] Date/Time: 04/01/19 0745   Procedures:      DENTAL RESTORATION/EXTRACTION WITH X-RAY (N/A )     EXAM UNDER ANESTHESIA WITH PAP SMEAR (N/A )   Anesthesia type: General   Pre-op diagnosis:      DENTAL CARIES,     ENCOUNTER FOR ROUTINE EXAM   [facility] RO[facility] / [facility]   Surgeons: Joanna Hews, DMD; Garfield Bing, MD      DISCUSSION: Patient is a 49 year female scheduled for the above procedures.  History includes never smoker, intellectual disability, non-verbal, poor posture, GERD, iron deficiency anemia, gastritis, urinary incontinence (occasional).  - Hospitalized with COVID-19 pneumonia 08/30/18-09/04/18 (+ test 08/24/18).  She lives at a [facility] group home on [address]. H&P form completed 03/16/19 by Dr. Katina Dung.  She will not be taking oral medications on the morning of surgery because she takes medication in applesauce. Previous preoperative notes indicate that she is on Depakote as a mood stabilizer and not for seizures.   By previous preoperative notes, she may need sedation prior to labs/PIV due to combativeness. There are several active labs orders entered (Valproic acid, Vitamin D, A1c, TSH/Free T4, Lipid panel, CMET, CBC with differential).  Anesthesia team to evaluate on the day of surgery. Appears she will get preoperative COVID-19 test on the day of surgery (+ 08/24/18).   VS:   Wt Readings from Last 3 Encounters:  09/04/18 60.9 kg  08/24/18 54.9 kg  11/26/17 54.9 kg   BP Readings from Last 3 Encounters:  09/04/18 95/78  08/24/18 90/72  11/26/17 126/68   Pulse Readings from Last 3 Encounters:  09/04/18 67  08/24/18 60  11/26/17 88    PROVIDERS: Royals, Gretta Began is PCP   LABS: She is for labs on the day of surgery.  Per DISCUSSION, several lab orders entered as required by Dr. Charlette Caffey. As of 09/03/18, Cr 0.48, AST 42, ALT 25, H/H 12.5/35.8, PLT 247, glucose 165, HIV  non-reactive.   IMAGES: 1V CXR 08/30/18: IMPRESSION: Multifocal bibasilar predominant airspace disease consistent with atypical viral pneumonia in this patient with known COVID infection.   EKG: 08/30/18: Sinus rhythm Atrial premature complex Borderline short PR interval Nonspecific T abnormalities, lateral leads No old tracing to compare Confirmed by Rolan Bucco [phone]) on 08/31/2018 9:01:24 AM   CV: N/A  Past Medical History:  Diagnosis Date  . Anemia   . Gastritis   . GERD (gastroesophageal reflux disease)   . Intellectual disability   . Iron deficiency   . Mental retardation   . Non-verbal learning disorder   . Poor posture    walks bent over  . Urine incontinence    occassional, uses depends    Past Surgical History:  Procedure Laterality Date  . DENTAL RESTORATION/EXTRACTION WITH X-RAY N/A 05/06/2014   Procedure: CLEANING XRAY, DENTAL RESTORATION AND EXTRACTIONS;  Surgeon: Esaw Dace., DDS;  [facility];  Service: Oral Surgery;  Laterality: N/A;  Recall Exam, Full mouth xrays and full mouth debridement,  Glass ionomers - #2 OF; #3 OL; #4 O; #6 F; #12 O; #13 O; #14 OFL; #20 DO Extractions - 12 Prime - #14, 15, 18, 19, 23, 27, 28, 29, 30, 31  . DENTAL RESTORATION/EXTRACTION WITH X-RAY N/A 06/15/2015   Procedure: DENTAL RESTORATION; EXTRACTION OF TEETH NUMBER TWO AND EIGHTEEN WITH X-RAY and cleaning;  Surgeon: Boneta Lucks, DDS;  Location: [facility] OR;  Service: Oral  Surgery;  Laterality: N/A;  . NO PAST SURGERIES      MEDICATIONS: No current facility-administered medications for this encounter.   Marland Kitchen acetaminophen (TYLENOL) 325 MG tablet  . alum & mag hydroxide-simeth (MAALOX/MYLANTA) 200-200-20 MG/5ML suspension  . bisacodyl (DULCOLAX) 10 MG suppository  . carbamide peroxide (DEBROX) 6.5 % OTIC solution  . Cholecalciferol (VITAMIN D3) 50 MCG (2000 UT) TABS  . diphenhydrAMINE (BENADRYL) 25 MG tablet  . divalproex (DEPAKOTE SPRINKLE) 125 MG capsule  .  DULoxetine (CYMBALTA) 60 MG capsule  . guaiFENesin (ROBITUSSIN) 100 MG/5ML liquid  . hydrocortisone cream 1 %  . ibuprofen (ADVIL) 200 MG tablet  . loperamide (IMODIUM A-D) 2 MG tablet  . LORazepam (ATIVAN) 1 MG tablet  . LORazepam (ATIVAN) 2 MG tablet  . magnesium hydroxide (MILK OF MAGNESIA) 400 MG/5ML suspension  . Melatonin 3 MG CAPS  . Neomycin-Bacitracin-Polymyxin (TRIPLE ANTIBIOTIC) [phone] OINT  . promethazine (PHENERGAN) 25 MG suppository  . promethazine (PHENERGAN) 25 MG tablet  . QUEtiapine (SEROQUEL) 25 MG tablet  . QUEtiapine (SEROQUEL) 50 MG tablet  . senna (SENOKOT) 8.6 MG TABS tablet  . Sunscreens (CARMEX CLASSIC LIP BALM) STCK  . triazolam (HALCION) 0.125 MG tablet  . vitamin B-12 (CYANOCOBALAMIN) 500 MCG tablet  . Vitamins A & D (VITAMIN A & D) ointment    Myra Gianotti, PA-C Surgical Short Stay/Anesthesiology [facility] Phone [phone] [address] [facility] Phone [phone] 03/31/2019 11:23 AM

## 2019-03-31 NOTE — Anesthesia Preprocedure Evaluation (Addendum)
Anesthesia Evaluation  Patient identified by MRN, date of birth, ID band Patient awake    Reviewed: Allergy & Precautions, NPO status , Patient's Chart, lab work & pertinent test results  Airway   TM Distance: >3 FB Neck ROM: Full  Mouth opening: Limited Mouth Opening Comment: Unable to assess airway 2/2 pt cooperation Dental  (+) Poor Dentition, Dental Advisory Given   Pulmonary neg pulmonary ROS,  COVID pneumonia 08/2018   Pulmonary exam normal breath sounds clear to auscultation       Cardiovascular negative cardio ROS Normal cardiovascular exam Rhythm:Regular Rate:Normal     Neuro/Psych negative neurological ROS  negative psych ROS   GI/Hepatic Neg liver ROS, GERD  ,  Endo/Other  negative endocrine ROS  Renal/GU negative Renal ROS  negative genitourinary   Musculoskeletal negative musculoskeletal ROS (+)   Abdominal   Peds  (+) mental retardation Hematology negative hematology ROS (+)   Anesthesia Other Findings Intellectual disability, uncooperative, nonverbal  Reproductive/Obstetrics                            Anesthesia Physical Anesthesia Plan  ASA: III  Anesthesia Plan: General   Post-op Pain Management:    Induction: Intravenous  PONV Risk Score and Plan: 3 and Ondansetron, Dexamethasone and Treatment may vary due to age or medical condition  Airway Management Planned: Nasal ETT  Additional Equipment:   Intra-op Plan:   Post-operative Plan: Extubation in OR  Informed Consent: I have reviewed the patients History and Physical, chart, labs and discussed the procedure including the risks, benefits and alternatives for the proposed anesthesia with the patient or authorized representative who has indicated his/her understanding and acceptance.     Dental advisory given and Consent reviewed with POA  Plan Discussed with:   Anesthesia Plan Comments: (Plan for IM  ketamine as pt will not cooperate with medical care.)      Anesthesia Quick Evaluation

## 2019-03-31 NOTE — Progress Notes (Signed)
Received call from Lucy at [facility] [phone] concerning surgical consents.  Informed Valentina Gu that we will need a verbal consent from patient's sister Baxter Flattery for consent to read "extractions and dental restorations as necessary" on DOS.  Sister Lawanna Kobus will not be here on DOS.  Placed a consent for GYN procedure on chart.  Dr Tinnie Gens or Dr Candelaria Celeste for GYN/Smear procedure after Dr Rocky Morel is finished with his procedure.  Baxter Flattery phone #[phone] will be on standby at 7am waiting for call from [facility] nurse.  She will also give verbal consent for GYN/Pap Smear procedure.  I called Rhea Bleacher at [facility] at [phone] to find out who will be doing the GYN procedure and asked for surgical orders to be placed in Epic.  Got Jacind's VM, LMOM with the above request and my number for a return call.

## 2019-03-31 NOTE — Progress Notes (Signed)
Pt SDW pre-op call completed by pt nurse, Bettye Boeck , LPN. Nurse denies pt experiencing any SOB, chest pain, and being under the care of a cardiologist. Nurse denies pt had a stress test, echo and cardiac cath.  Nurse made aware to stop administering Aspirin, vitamins, fish oil, Melatonin and herbal medications. Do not take any NSAIDs ie: Ibuprofen, Advil, Naproxen or any medication containing Aspirin. Nurse stated that MD wrote an order to hold pt morning medications DOS because pt takes medications with applesauce. Pt takes Depakote in the morning; can we consider IV? Nurse verbalized understanding of all pre-op instructions. PA, Anesthesiology, asked to review pt history.

## 2019-04-01 ENCOUNTER — Ambulatory Visit (HOSPITAL_COMMUNITY): Payer: Medicare Other | Admitting: Vascular Surgery

## 2019-04-01 ENCOUNTER — Encounter (HOSPITAL_COMMUNITY): Payer: Self-pay

## 2019-04-01 ENCOUNTER — Encounter (HOSPITAL_COMMUNITY)
Admission: RE | Disposition: A | Discharge status: Skilled Nursing Facility | Payer: Self-pay | Source: Home / Self Care | Attending: Dentistry

## 2019-04-01 ENCOUNTER — Other Ambulatory Visit (HOSPITAL_COMMUNITY)
Admission: RE | Admit: 2019-04-01 | Discharge: 2019-04-01 | Disposition: A | Discharge status: Home / Self Care | Payer: Medicare Other | Source: Ambulatory Visit | Attending: Obstetrics and Gynecology | Admitting: Obstetrics and Gynecology

## 2019-04-01 ENCOUNTER — Ambulatory Visit (HOSPITAL_COMMUNITY)
Admission: RE | Admit: 2019-04-01 | Discharge: 2019-04-01 | Disposition: A | Discharge status: Skilled Nursing Facility | Payer: Medicare Other | Attending: Dentistry | Admitting: Dentistry

## 2019-04-01 DIAGNOSIS — Z8719 Personal history of other diseases of the digestive system: Secondary | ICD-10-CM | POA: Diagnosis not present

## 2019-04-01 DIAGNOSIS — F79 Unspecified intellectual disabilities: Secondary | ICD-10-CM | POA: Insufficient documentation

## 2019-04-01 DIAGNOSIS — K029 Dental caries, unspecified: Secondary | ICD-10-CM | POA: Insufficient documentation

## 2019-04-01 DIAGNOSIS — Z8616 Personal history of COVID-19: Secondary | ICD-10-CM | POA: Diagnosis not present

## 2019-04-01 DIAGNOSIS — Z01419 Encounter for gynecological examination (general) (routine) without abnormal findings: Secondary | ICD-10-CM

## 2019-04-01 DIAGNOSIS — F840 Autistic disorder: Secondary | ICD-10-CM

## 2019-04-01 DIAGNOSIS — K219 Gastro-esophageal reflux disease without esophagitis: Secondary | ICD-10-CM | POA: Diagnosis not present

## 2019-04-01 DIAGNOSIS — K2970 Gastritis, unspecified, without bleeding: Secondary | ICD-10-CM | POA: Insufficient documentation

## 2019-04-01 DIAGNOSIS — Z79899 Other long term (current) drug therapy: Secondary | ICD-10-CM | POA: Diagnosis not present

## 2019-04-01 HISTORY — PX: DENTAL RESTORATION/EXTRACTION WITH X-RAY: SHX5796

## 2019-04-01 LAB — VALPROIC ACID LEVEL: Valproic Acid Lvl: 58 ug/mL (ref 50.0–100.0)

## 2019-04-01 LAB — CBC WITH DIFFERENTIAL/PLATELET
Abs Immature Granulocytes: 0 10*3/uL (ref 0.00–0.07)
Basophils Absolute: 0 10*3/uL (ref 0.0–0.1)
Basophils Relative: 1 %
Eosinophils Absolute: 0.1 10*3/uL (ref 0.0–0.5)
Eosinophils Relative: 2 %
HCT: 31.9 % — ABNORMAL LOW (ref 36.0–46.0)
Hemoglobin: 11 g/dL — ABNORMAL LOW (ref 12.0–15.0)
Lymphocytes Relative: 56 %
Lymphs Abs: 1.7 10*3/uL (ref 0.7–4.0)
MCH: 31.6 pg (ref 26.0–34.0)
MCHC: 34.5 g/dL (ref 30.0–36.0)
MCV: 91.7 fL (ref 80.0–100.0)
Monocytes Absolute: 0.1 10*3/uL (ref 0.1–1.0)
Monocytes Relative: 4 %
Neutro Abs: 1.1 10*3/uL — ABNORMAL LOW (ref 1.7–7.7)
Neutrophils Relative %: 37 %
Platelets: 151 10*3/uL (ref 150–400)
RBC: 3.48 MIL/uL — ABNORMAL LOW (ref 3.87–5.11)
RDW: 12.6 % (ref 11.5–15.5)
WBC: 3.1 10*3/uL — ABNORMAL LOW (ref 4.0–10.5)
nRBC: 0 % (ref 0.0–0.2)
nRBC: 0 /100 WBC

## 2019-04-01 LAB — POCT I-STAT, CHEM 8
BUN: 18 mg/dL (ref 6–20)
Calcium, Ion: 1.15 mmol/L (ref 1.15–1.40)
Chloride: 100 mmol/L (ref 98–111)
Creatinine, Ser: 0.5 mg/dL (ref 0.44–1.00)
Glucose, Bld: 89 mg/dL (ref 70–99)
HCT: 33 % — ABNORMAL LOW (ref 36.0–46.0)
Hemoglobin: 11.2 g/dL — ABNORMAL LOW (ref 12.0–15.0)
Potassium: 4.4 mmol/L (ref 3.5–5.1)
Sodium: 137 mmol/L (ref 135–145)
TCO2: 33 mmol/L — ABNORMAL HIGH (ref 22–32)

## 2019-04-01 LAB — TSH: TSH: 5.218 u[IU]/mL — ABNORMAL HIGH (ref 0.350–4.500)

## 2019-04-01 LAB — COMPREHENSIVE METABOLIC PANEL
ALT: 17 U/L (ref 0–44)
AST: 20 U/L (ref 15–41)
Albumin: 3.3 g/dL — ABNORMAL LOW (ref 3.5–5.0)
Alkaline Phosphatase: 46 U/L (ref 38–126)
Anion gap: 8 (ref 5–15)
BUN: 15 mg/dL (ref 6–20)
CO2: 27 mmol/L (ref 22–32)
Calcium: 8.7 mg/dL — ABNORMAL LOW (ref 8.9–10.3)
Chloride: 102 mmol/L (ref 98–111)
Creatinine, Ser: 0.51 mg/dL (ref 0.44–1.00)
GFR calc Af Amer: >60 mL/min (ref >60)
GFR calc non Af Amer: >60 mL/min (ref >60)
Glucose, Bld: 87 mg/dL (ref 70–99)
Potassium: 4.2 mmol/L (ref 3.5–5.1)
Sodium: 137 mmol/L (ref 135–145)
Total Bilirubin: 0.7 mg/dL (ref 0.3–1.2)
Total Protein: 6 g/dL — ABNORMAL LOW (ref 6.5–8.1)

## 2019-04-01 LAB — LIPID PANEL
Cholesterol: 172 mg/dL (ref 0–200)
HDL: 42 mg/dL (ref >40)
LDL Cholesterol: 114 mg/dL — ABNORMAL HIGH (ref 0–99)
Total CHOL/HDL Ratio: 4.1 RATIO
Triglycerides: 79 mg/dL (ref <150)
VLDL: 16 mg/dL (ref 0–40)

## 2019-04-01 LAB — HEMOGLOBIN A1C
Hgb A1c MFr Bld: 4.8 % (ref 4.8–5.6)
Mean Plasma Glucose: 91.06 mg/dL

## 2019-04-01 LAB — RESPIRATORY PANEL BY RT PCR (FLU A&B, COVID)
Influenza A by PCR: NEGATIVE
Influenza B by PCR: NEGATIVE
SARS Coronavirus 2 by RT PCR: NEGATIVE

## 2019-04-01 LAB — T4, FREE: Free T4: 0.66 ng/dL (ref 0.61–1.12)

## 2019-04-01 LAB — VITAMIN D 25 HYDROXY (VIT D DEFICIENCY, FRACTURES): Vit D, 25-Hydroxy: 47.59 ng/mL (ref 30–100)

## 2019-04-01 SURGERY — DENTAL RESTORATION/EXTRACTION WITH X-RAY
Anesthesia: General | Site: Mouth

## 2019-04-01 MED ORDER — LIDOCAINE HCL 2 % IJ SOLN
INTRAVENOUS | Status: DC | PRN
  Administered 2019-04-01: .8 mL

## 2019-04-01 MED ORDER — MIDAZOLAM HCL 2 MG/ML PO SYRP: 20.0000 mg | ORAL_SOLUTION | Freq: Once | ORAL | Status: DC

## 2019-04-01 MED ORDER — PHENYLEPHRINE HCL (PRESSORS) 10 MG/ML IV SOLN
INTRAVENOUS | Status: DC | PRN
  Administered 2019-04-01: 80 ug via INTRAVENOUS

## 2019-04-01 MED ORDER — HEMOSTATIC AGENTS (NO CHARGE) OPTIME
TOPICAL | Status: DC | PRN
  Administered 2019-04-01: 1 via TOPICAL

## 2019-04-01 MED ORDER — LACTATED RINGERS IV SOLN: INTRAVENOUS | Status: DC | PRN

## 2019-04-01 MED ORDER — OXYMETAZOLINE HCL 0.05 % NA SOLN
NASAL | Status: AC
  Filled 2019-04-01: qty 30

## 2019-04-01 MED ORDER — OXYMETAZOLINE HCL 0.05 % NA SOLN
NASAL | Status: DC | PRN
  Administered 2019-04-01: 2 via NASAL

## 2019-04-01 MED ORDER — FENTANYL CITRATE (PF) 250 MCG/5ML IJ SOLN
INTRAMUSCULAR | Status: AC
  Filled 2019-04-01: qty 5

## 2019-04-01 MED ORDER — PROPOFOL 10 MG/ML IV BOLUS
INTRAVENOUS | Status: DC | PRN
  Administered 2019-04-01: 20 mg via INTRAVENOUS
  Administered 2019-04-01: 50 mg via INTRAVENOUS
  Administered 2019-04-01: 20 mg via INTRAVENOUS
  Administered 2019-04-01: 30 mg via INTRAVENOUS

## 2019-04-01 MED ORDER — FENTANYL CITRATE (PF) 100 MCG/2ML IJ SOLN
INTRAMUSCULAR | Status: DC | PRN
  Administered 2019-04-01: 25 ug via INTRAVENOUS

## 2019-04-01 MED ORDER — DEXAMETHASONE SODIUM PHOSPHATE 4 MG/ML IJ SOLN
INTRAMUSCULAR | Status: DC | PRN
  Administered 2019-04-01: 5 mg via INTRAVENOUS

## 2019-04-01 MED ORDER — LIDOCAINE 2% (20 MG/ML) 5 ML SYRINGE
INTRAMUSCULAR | Status: DC | PRN
  Administered 2019-04-01: 20 mg via INTRAVENOUS

## 2019-04-01 MED ORDER — KETAMINE HCL 100 MG/ML IJ SOLN
250.0000 mg | Freq: Once | INTRAMUSCULAR | Status: AC
  Administered 2019-04-01: 250 mg via INTRAMUSCULAR
  Filled 2019-04-01: qty 2.5

## 2019-04-01 MED ORDER — FENTANYL CITRATE (PF) 100 MCG/2ML IJ SOLN: 25.0000 ug | INTRAMUSCULAR | Status: DC | PRN

## 2019-04-01 MED ORDER — MIDAZOLAM HCL 2 MG/2ML IJ SOLN
INTRAMUSCULAR | Status: AC
  Filled 2019-04-01: qty 2

## 2019-04-01 MED ORDER — EPHEDRINE SULFATE 50 MG/ML IJ SOLN
INTRAMUSCULAR | Status: DC | PRN
  Administered 2019-04-01: 10 mg via INTRAVENOUS

## 2019-04-01 MED ORDER — SODIUM CHLORIDE 0.9 % IV SOLN
INTRAVENOUS | Status: DC | PRN
  Administered 2019-04-01: 30 ug/min via INTRAVENOUS

## 2019-04-01 MED ORDER — CHLORHEXIDINE GLUCONATE 0.12 % MT SOLN
OROMUCOSAL | Status: AC
  Filled 2019-04-01: qty 15

## 2019-04-01 MED ORDER — SUCCINYLCHOLINE CHLORIDE 20 MG/ML IJ SOLN
INTRAMUSCULAR | Status: DC | PRN
  Administered 2019-04-01: 40 mg via INTRAVENOUS

## 2019-04-01 MED ORDER — PROPOFOL 10 MG/ML IV BOLUS
INTRAVENOUS | Status: AC
  Filled 2019-04-01: qty 20

## 2019-04-01 MED ORDER — ONDANSETRON HCL 4 MG/2ML IJ SOLN
INTRAMUSCULAR | Status: DC | PRN
  Administered 2019-04-01: 4 mg via INTRAVENOUS

## 2019-04-01 MED ORDER — MIDAZOLAM HCL 2 MG/ML PO SYRP
ORAL_SOLUTION | ORAL | Status: AC
  Filled 2019-04-01: qty 10

## 2019-04-01 MED ORDER — LIDOCAINE-EPINEPHRINE 2 %-1:100000 IJ SOLN
INTRAMUSCULAR | Status: AC
  Filled 2019-04-01: qty 10.2

## 2019-04-01 MED ORDER — KETOROLAC TROMETHAMINE 30 MG/ML IJ SOLN
INTRAMUSCULAR | Status: DC | PRN
  Administered 2019-04-01: 30 mg via INTRAVENOUS

## 2019-04-01 MED ORDER — STERILE WATER FOR IRRIGATION IR SOLN
Status: DC | PRN
  Administered 2019-04-01: 1000 mL

## 2019-04-01 SURGICAL SUPPLY — 28 items
BLADE SURG 15 STRL LF DISP TIS (BLADE) ×2 IMPLANT
BLADE SURG 15 STRL SS (BLADE) ×2
BUR ROUND PRECISION 4.0 (BURR) IMPLANT
BUR ROUND PRECISION 4.0MM (BURR)
CANISTER SUCT 3000ML PPV (MISCELLANEOUS) ×4 IMPLANT
COVER BACK TABLE 60X90IN (DRAPES) ×4 IMPLANT
COVER MAYO STAND STRL (DRAPES) ×4 IMPLANT
COVER PROBE W GEL 5X96 (DRAPES) IMPLANT
COVER SURGICAL LIGHT HANDLE (MISCELLANEOUS) ×4 IMPLANT
DRAPE HALF SHEET 40X57 (DRAPES) IMPLANT
GAUZE 4X4 16PLY RFD (DISPOSABLE) ×4 IMPLANT
GAUZE SPONGE 4X4 16PLY XRAY LF (GAUZE/BANDAGES/DRESSINGS) IMPLANT
GLOVE BIO SURGEON STRL SZ 6.5 (GLOVE) ×3 IMPLANT
GLOVE BIO SURGEONS STRL SZ 6.5 (GLOVE) ×1
GOWN STRL REUS W/ TWL LRG LVL3 (GOWN DISPOSABLE) ×4 IMPLANT
GOWN STRL REUS W/TWL LRG LVL3 (GOWN DISPOSABLE) ×4
KIT BASIN OR (CUSTOM PROCEDURE TRAY) ×4 IMPLANT
KIT TURNOVER KIT B (KITS) ×4 IMPLANT
NEEDLE DENTAL 27 LONG (NEEDLE) ×4 IMPLANT
PAD ARMBOARD 7.5X6 YLW CONV (MISCELLANEOUS) ×4 IMPLANT
SPONGE SURGIFOAM ABS GEL SZ50 (HEMOSTASIS) IMPLANT
SUT CHROMIC 3 0 PS 2 (SUTURE) ×4 IMPLANT
TOOTHBRUSH ADULT (PERSONAL CARE ITEMS) ×4 IMPLANT
TOWEL GREEN STERILE (TOWEL DISPOSABLE) ×4 IMPLANT
TUBE CONNECTING 12'X1/4 (SUCTIONS) ×1
TUBE CONNECTING 12X1/4 (SUCTIONS) ×3 IMPLANT
WATER TABLETS ICX (MISCELLANEOUS) ×4 IMPLANT
YANKAUER SUCT BULB TIP NO VENT (SUCTIONS) ×4 IMPLANT

## 2019-04-01 NOTE — H&P (Signed)
H&P reviewed. Stable for surgery Garrin Kirwan H Chenel Wernli, DMD  

## 2019-04-01 NOTE — Anesthesia Procedure Notes (Addendum)
Procedure Name: Intubation Date/Time: 04/01/2019 8:53 AM Performed by: Jed Limerick, CRNA Pre-anesthesia Checklist: Patient identified, Emergency Drugs available, Suction available and Patient being monitored Patient Re-evaluated:Patient Re-evaluated prior to induction Oxygen Delivery Method: Circle System Utilized Preoxygenation: Pre-oxygenation with 100% oxygen Induction Type: IV induction and Rapid sequence Laryngoscope Size: Glidescope and 3 Grade View: Grade I Nasal Tubes: Nasal Rae, Nasal prep performed, Magill forceps - small, utilized and Left Tube size: 6.0 mm Number of attempts: 1 Placement Confirmation: ETT inserted through vocal cords under direct vision,  positive ETCO2 and breath sounds checked- equal and bilateral Secured at: 24 cm Tube secured with: Tape Dental Injury: Teeth and Oropharynx as per pre-operative assessment  Comments: SRNA intubated under CRNA supervision NETT padded and secured without pressure to nares

## 2019-04-01 NOTE — Transfer of Care (Signed)
Immediate Anesthesia Transfer of Care Note  Patient: Hannah Houston  Procedure(s) Performed: DENTAL RESTORATION/EXTRACTION WITH X-RAY composite: 7L, 10L Glass ionoer: 11DL, , 13B, 17extraction (N/A Mouth) EXAM UNDER ANESTHESIA WITH PAP SMEAR (N/A )  Patient Location: PACU  Anesthesia Type:General  Level of Consciousness: drowsy  Airway & Oxygen Therapy: Patient Spontanous Breathing and Patient connected to face mask oxygen  Post-op Assessment: Report given to RN and Post -op Vital signs reviewed and stable  Post vital signs: Reviewed and stable  Last Vitals:  Vitals Value Taken Time  BP 107/76 04/01/19 1124  Temp    Pulse    Resp 15 04/01/19 1128  SpO2    Vitals shown include unvalidated device data.  Last Pain:  Vitals:   04/01/19 0708  PainSc: 0-No pain         Complications: No apparent anesthesia complications

## 2019-04-01 NOTE — Brief Op Note (Signed)
04/01/2019  10:49 AM  PATIENT:  Hannah Houston  49 y.o. female  PRE-OPERATIVE DIAGNOSIS:  DENTAL CARIES, ENCOUNTER FOR ROUTINE EXAM  POST-OPERATIVE DIAGNOSIS:  DENTAL CARIES, ENCOUNTER FOR ROUTINE EXAM  PROCEDURE:  Procedure(s): DENTAL RESTORATION/EXTRACTION WITH X-RAY (N/A) EXAM UNDER ANESTHESIA WITH PAP SMEAR (N/A)  SURGEON:  Surgeon(s) and Role: Panel 1:    * Jozlin Bently H, DMD - Primary Panel 2:    * [place] Bing, MD - Primary  PHYSICIAN ASSISTANT:   ASSISTANTS: Tereasa Coop   ANESTHESIA:   general  EBL:  Minimal   BLOOD ADMINISTERED:none  DRAINS: none   LOCAL MEDICATIONS USED:  LIDOCAINE   SPECIMEN:  No Specimen  DISPOSITION OF SPECIMEN:  N/A  COUNTS:  YES  TOURNIQUET:  * No tourniquets in log *  DICTATION: .Dragon Dictation  PLAN OF CARE: Discharge to home after PACU  PATIENT DISPOSITION:  PACU - hemodynamically stable.   Delay start of Pharmacological VTE agent (>24hrs) due to surgical blood loss or risk of bleeding: not applicable

## 2019-04-01 NOTE — Progress Notes (Signed)
GYN Note Patient's legal guardian (Sister, Baxter Flattery) consented patient for exam under anesthesia and pap smear for routine exam and cervical cancer screening. Will order pap with hpv cotesting  Cornelia Copa MD Attending Center for [facility]) GYN Consult Phone: [phone] (M-F, [phone]) & [phone] (Off hours, weekends, holidays)

## 2019-04-01 NOTE — Op Note (Signed)
Operative Note   04/01/2019  PRE-OP DIAGNOSIS: Need for routine GYN exam. Inability to tolerate in office exam   POST-OP DIAGNOSIS: Same.   SURGEON: Conroy Bing, Montez Hageman MD  ASSISTANT: None  PROCEDURE:  Exam under anesthesia, pap smear  ANESTHESIA: General  ESTIMATED BLOOD LOSS: None  DRAINS: None  TOTAL IV FLUIDS: per anesthesia note  SPECIMENS: pap smear (STD, cytology and HPV testing requested)  VTE PROPHYLAXIS: SCDs to the bilateral lower extremities  ANTIBIOTICS: None indicated.   COMPLICATIONS: none  DISPOSITION: PACU - hemodynamically stable.  CONDITION: stable  FINDINGS: Exam under anesthesia revealed 6-8 week sized anteverted, mobile uterus. No adnexal masses palpated. Normal EGBUS. Patient has very narrow vagina (a tight two finger breadths wide), cervix palpates normal. On speculum exam, normal vagina and cervix.   PROCEDURE IN DETAIL:  After informed consent was obtained, the patient was taken to the operating room where anesthesia was obtained without difficulty and the dental procedure done first. The patient was placed in lithotomy position, and a bimanual exam done. Next, a Pederson speculum was placed and a pap smear done. The patient was then taken out of lithotomy.   Cornelia Copa MD Attending Center for [employer] (Faculty Practice) [phone]

## 2019-04-01 NOTE — Anesthesia Postprocedure Evaluation (Signed)
Anesthesia Post Note  Patient: Hannah Houston  Procedure(s) Performed: DENTAL RESTORATION/EXTRACTION WITH X-RAY composite: 7L, 10L Glass ionoer: 11DL, , 13B, 17extraction (N/A Mouth) EXAM UNDER ANESTHESIA WITH PAP SMEAR (N/A )     Patient location during evaluation: PACU Anesthesia Type: General Level of consciousness: awake and alert and patient cooperative (at baseline) Pain management: pain level controlled Vital Signs Assessment: post-procedure vital signs reviewed and stable Respiratory status: spontaneous breathing, nonlabored ventilation and respiratory function stable Cardiovascular status: blood pressure returned to baseline and stable Postop Assessment: no apparent nausea or vomiting Anesthetic complications: no    Last Vitals:  Vitals:   04/01/19 1200 04/01/19 1201  BP:  (!) 97/45  Pulse: 67 66  Resp: 13 13  Temp:    SpO2: 99% 100%    Last Pain:  Vitals:   04/01/19 1130  PainSc: Asleep                 Beryle Lathe

## 2019-04-01 NOTE — Op Note (Signed)
[facility]  04/01/2019 Hannah Houston [id number]  Preop DX: Dental caries/behavior management issues due to intellectual disabilities/developmental disabilities. Dental Care provided in OR for medically necessary treatment.  Surgeon: Joanna Hews, DMD  Assistant: Tereasa Coop and hospital staff.  Anesthesia: General  Procedure: The patient was brought into the operating room and placed on the table in a supine position.  General anesthesia was administered via nasal intubation.  The patient was prepped and draped in the usual manner for an intra-oral general dentistry procedure. The oropharynx was suctioned and a moistened oropharyngeal throat pack was placed.    A full intra-oral exam including all hard and soft tissues was performed.  Type of Exam: Recall   Soft Tissue Exam: Floor of the mouth: Normal Buccal mucosa: Normal Soft palate: Normal Hard palate: Normal Tongue: Normal Gingival: Normal Frenum: Normal  Hard tissue exam:  Present: # 3-14, 17,21-23 Missing: # 1-2,15-16,18-20, 24-32 Radiographic findings: generalized subgingival calculus Radiographic findings decay: # 78M, 13B;  non restorable decay #17  Full mouth series of digital radiographs taken and reviewed. A comprehensive treatment plan was developed.  Operative care was accomplished in a standard fashion using high/low speed drills with copious irrigation.  Routine extractions were accomplished with simple elevation and use of forceps.   All surgical sites were irrigated with copious amounts of saline. Gel foam was placed in the sockets and hemostasis established with firm pressure.  Local Anes:Lidocaine 2% with 1:100,000 epinephrine 0.8 mls The estimated blood loss was minimal.   Upon completion of all procedures the oropharynx was irrigated of all debris. Mouth was suctioned dry and a posterior throat pack was carefully removed with constant suction. Hemostasis was established and a gauze pack  was placed as an intraoral pressure dressing. After spontaneous respirations the patient was extubated and transported to the Post-Anesthesia care unit in awake but in a sedated condition. The patient tolerated the procedure well and without complications.  An explanation of procedures and extractions were given to caregiver.  Operative Procedures:  Full mouth debridement: Yes Extractions completed: # 17 Composites: # 7L (coronal, chewing, dentin), 10L (coronal, chewing, dentin) Glass Ionomers: #11DL (coronal, chewing, dentin), (coronal, chewing, dentin), 13B (coronal, smooth, dentin) Fluoride varnish: Yes Gingivectomy: No Alveloplasty: No   Postoperative Meds:  Ibuprofen 600mg  and one tylenol every 6 hours prn pain  Postoperative Instructions: Extraction sheet signed and given to patient representative.    , DMD

## 2019-04-02 LAB — CYTOLOGY - PAP
Adequacy: ABSENT
Comment: NEGATIVE
Diagnosis: NEGATIVE
High risk HPV: NEGATIVE

## 2019-12-17 IMAGING — DX PORTABLE CHEST - 1 VIEW
1 series · 1 of 1 positions shown · non-contrast
Comparison: None.

CLINICAL DATA: Hypotensive. Lethargy. COVID positive.

EXAM:
PORTABLE CHEST 1 VIEW

[chest ap]
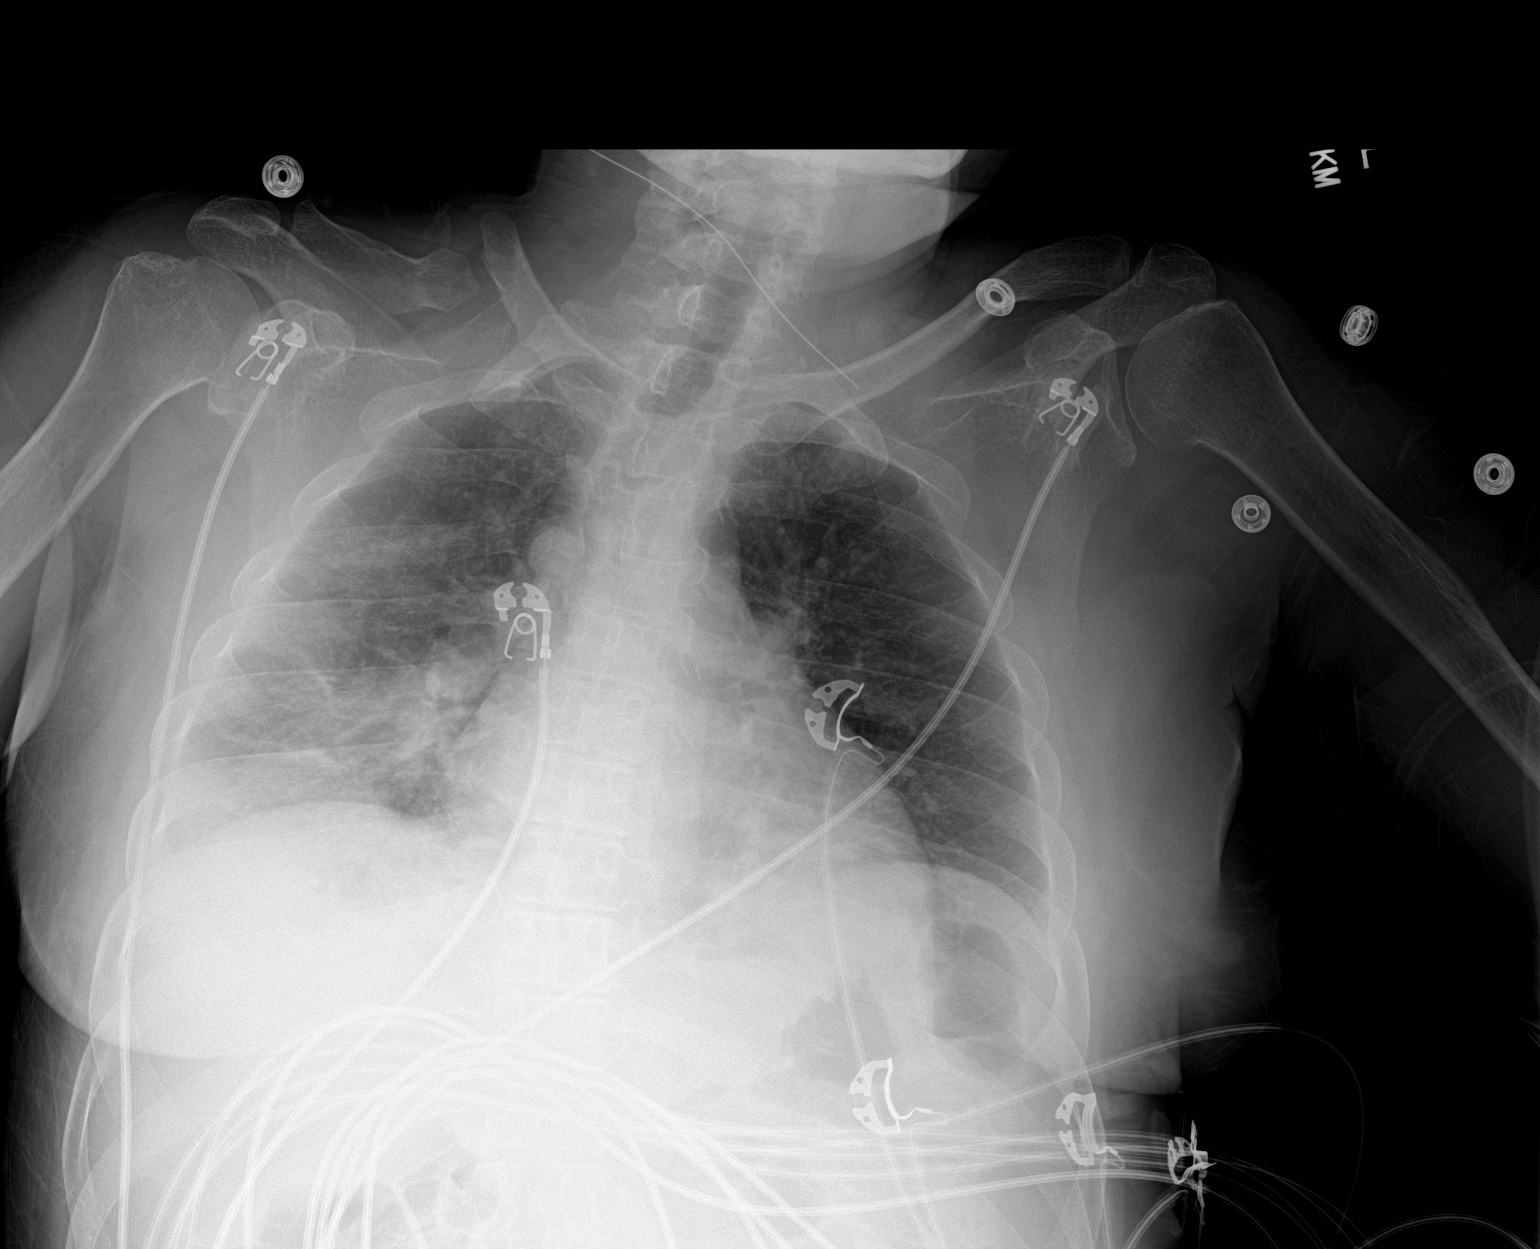

[1 of 1 positions shown; findings below may reference images not displayed]

FINDINGS: Patchy bibasilar airspace disease, right greater than left. Lesser
patchy opacities in the right mid and upper lung zones. Lung volumes
are low. Upper normal heart size likely accentuated by technique and
low lung volumes. No evidence of pleural effusion or pneumothorax.
Remote midshaft right clavicle fracture.
IMPRESSION: Multifocal bibasilar predominant airspace disease consistent with
atypical viral pneumonia in this patient with known COVID infection.

## 2020-11-09 ENCOUNTER — Other Ambulatory Visit: Payer: Self-pay

## 2020-11-09 ENCOUNTER — Encounter: Payer: Self-pay | Admitting: Obstetrics and Gynecology

## 2020-11-09 ENCOUNTER — Ambulatory Visit (INDEPENDENT_AMBULATORY_CARE_PROVIDER_SITE_OTHER): Payer: Medicare Other | Admitting: Obstetrics and Gynecology

## 2020-11-09 VITALS — Wt 127.0 lb

## 2020-11-09 DIAGNOSIS — Z3042 Encounter for surveillance of injectable contraceptive: Secondary | ICD-10-CM

## 2020-11-09 DIAGNOSIS — N920 Excessive and frequent menstruation with regular cycle: Secondary | ICD-10-CM | POA: Insufficient documentation

## 2020-11-09 MED ORDER — MEDROXYPROGESTERONE ACETATE 150 MG/ML IM SUSP
150.0000 mg | Freq: Once | INTRAMUSCULAR | Status: AC
  Administered 2020-11-09: 150 mg via INTRAMUSCULAR

## 2020-11-09 MED ORDER — MEDROXYPROGESTERONE ACETATE 150 MG/ML IM SUSP: 150.0000 mg | INTRAMUSCULAR | 3 refills | Status: AC

## 2020-11-09 NOTE — Progress Notes (Signed)
Obstetrics and Gynecology New Patient Evaluation  Appointment Date: 11/09/2020  OBGYN Clinic: Center for [facility]  Primary Care Provider: Lucretia Field  Referring Provider: [facility])  Chief Complaint:  Chief Complaint  Patient presents with   Menstrual Problem    History of Present Illness: Hannah Houston is a 50 y.o. Caucasian G0P0000 (Patient's last menstrual period was 10/22/2020.), seen for the above chief complaint. Her past medical history is significant for severe MR.  Per the staff and her caregiver, who is present and the historian for today, the patient has menorrhagia and has been eating the clots and tearing and throwing the pull up she uses for her menses  This has long standing menorrhagia but only recently has she exhibited this behavior.  The caregiver believes she was on depo provera in the past and was amenorrheic but for some reason she stopped getting the shots.  She was seen by Dr. Erin Fulling for an EUA and a pap smear in 2018 and the H&P noted she had very heavy cycles in the past year and were also painful and that she started having cycles only in the past year and that she was moved to a new facility in 2015 and was not having cycles then. Exam notable for narrow introitus. Pap negative then; no hpv testing done. Depo provera recommended and she received a dose in march 2018 and that is the only dose I see given by our clinic.  I saw her in [place] 2021 when I was asked to do a pap smear on her when she was under anesthesia for a dental procedure. Consent obtained from her sister and pap smear and hpv were negative. Exam notable for very narrow vagina (a tight two finger breadths wide).  Review of Systems: Pertinent items are noted in HPI.   Patient Active Problem List   Diagnosis Date Noted   Menorrhagia with regular cycle 11/09/2020   Pneumonia due to COVID-19 virus 08/31/2018   Autism 08/31/2018    Encounter for gynecological examination with Papanicolaou smear of cervix 08/06/2016     Past Medical History:  Past Medical History:  Diagnosis Date   Anemia    Gastritis    GERD (gastroesophageal reflux disease)    Intellectual disability    Iron deficiency    Mental retardation    Non-verbal learning disorder    Poor posture    walks bent over   Urine incontinence    occassional, uses depends    Past Surgical History:  Past Surgical History:  Procedure Laterality Date   DENTAL RESTORATION/EXTRACTION WITH X-RAY N/A 05/06/2014   Procedure: CLEANING XRAY, DENTAL RESTORATION AND EXTRACTIONS;  Surgeon: Esaw Dace., DDS;  Location: [facility] OR;  Service: Oral Surgery;  Laterality: N/A;  Recall Exam, Full mouth xrays and full mouth debridement,  Glass ionomers - #2 OF; #3 OL; #4 O; #6 F; #12 O; #13 O; #14 OFL; #20 DO Extractions - 12 Prime - #14, 15, 18, 19, 23, 27, 28, 29, 30, 31   DENTAL RESTORATION/EXTRACTION WITH X-RAY N/A 06/15/2015   Procedure: DENTAL RESTORATION; EXTRACTION OF TEETH NUMBER TWO AND EIGHTEEN WITH X-RAY and cleaning;  Surgeon: Boneta Lucks, DDS;  Location: [facility] OR;  Service: Oral Surgery;  Laterality: N/A;   DENTAL RESTORATION/EXTRACTION WITH X-RAY N/A 04/01/2019   Procedure: DENTAL RESTORATION/EXTRACTION WITH X-RAY composite: 7L, 10L Glass ionoer: 11DL, , 13B, 17extraction;  Surgeon: Joanna Hews, DMD;  Location: [facility] OR;  Service: Dentistry;  Laterality: N/A;   NO PAST SURGERIES      Past Obstetrical History:  OB History  Gravida Para Term Preterm AB Living  0 0 0 0 0 0  SAB IAB Ectopic Multiple Live Births  0 0 0 0 0    Past Gynecological History: As per HPI.  Social History:  Social History   Socioeconomic History   Marital status: Single    Spouse name: Not on file   Number of children: Not on file   Years of education: Not on file   Highest education level: Not on file  Occupational History   Not on file  Tobacco Use   Smoking status:  Never   Smokeless tobacco: Never  Vaping Use   Vaping Use: Never used  Substance and Sexual Activity   Alcohol use: No   Drug use: No   Sexual activity: Never  Other Topics Concern   Not on file  Social History Narrative   Not on file   Social Determinants of Health   Financial Resource Strain: Not on file  Food Insecurity: Not on file  Transportation Needs: Not on file  Physical Activity: Not on file  Stress: Not on file  Social Connections: Not on file  Intimate Partner Violence: Not on file    Family History:  Family History  Family history unknown: Yes    Medications We administered medroxyPROGESTERone. Current Outpatient Medications  Medication Sig Dispense Refill   divalproex (DEPAKOTE SPRINKLE) 125 MG capsule Take 250 mg by mouth 2 (two) times daily. (0800 & 2000)     DULoxetine (CYMBALTA) 60 MG capsule Take 60 mg by mouth daily. (0800)     LORazepam (ATIVAN) 1 MG tablet Take 1-2 tablets (1-2 mg total) by mouth 3 (three) times daily. Takes 1 mg in the morning at 8:00 am, 1 mg in the afternoon at 1400 and 1 mg in the evening 2000 And 1 tablet 30 minutes prior to procedure (Patient taking differently: Take 1-2 mg by mouth See admin instructions. Take 1 tablet (1mg ) by mouth every morning(0800) & Take 2 tablets (2 mg) by mouth twice daily (1400 & 2000)) 30 tablet 0   Melatonin 3 MG CAPS Take 3 mg by mouth at bedtime. (2000)     promethazine (PHENERGAN) 25 MG tablet Take 25 mg by mouth every 4 (four) hours as needed (vomiting 2 or more times in 4 hours).     QUEtiapine (SEROQUEL) 25 MG tablet Take 25 mg by mouth daily. (0800)     QUEtiapine (SEROQUEL) 50 MG tablet Take 50 mg by mouth at bedtime. (2000)     senna (SENOKOT) 8.6 MG TABS tablet Take 1 tablet by mouth at bedtime. (2000)     triazolam (HALCION) 0.125 MG tablet Take 0.375 mg by mouth as needed (30 minutes prior to dental & medical procedures.).     vitamin B-12 (CYANOCOBALAMIN) 500 MCG tablet Take 500 mcg by  mouth daily. (0800)     Vitamins A & D (VITAMIN A & D) ointment Apply 1 application topically daily. (0800) To lower legs and arms daily     acetaminophen (TYLENOL) 325 MG tablet Take 650 mg by mouth every 4 (four) hours as needed for mild pain, moderate pain, fever or headache. And 3 times daily from 9/10-9/13 (Patient not taking: Reported on 11/09/2020)     alum & mag hydroxide-simeth (MAALOX/MYLANTA) 200-200-20 MG/5ML suspension Take 15 mLs by mouth every 6 (six) hours as needed for indigestion or heartburn. (Patient not  taking: Reported on 11/09/2020)     bisacodyl (DULCOLAX) 10 MG suppository Place 10 mg rectally as needed for moderate constipation. (Patient not taking: Reported on 11/09/2020)     carbamide peroxide (DEBROX) 6.5 % OTIC solution Place 5 drops into both ears as needed (ear wax removal). (Patient not taking: Reported on 11/09/2020)     Cholecalciferol (VITAMIN D3) 50 MCG (2000 UT) TABS Take 2,000 Units by mouth daily. (0800) (Patient not taking: Reported on 11/09/2020)     diphenhydrAMINE (BENADRYL) 25 MG tablet Take 25 mg by mouth every 4 (four) hours as needed (nasal congestion/itching/allergies.). (Patient not taking: Reported on 11/09/2020)     guaiFENesin (ROBITUSSIN) 100 MG/5ML liquid Take 300 mg by mouth every 4 (four) hours as needed for cough. (Patient not taking: Reported on 11/09/2020)     hydrocortisone cream 1 % Apply 1 application topically as needed for itching. (Patient not taking: Reported on 11/09/2020)     ibuprofen (ADVIL) 200 MG tablet Take 200-600 mg by mouth every 8 (eight) hours as needed (discomfort/pain). (Patient not taking: Reported on 11/09/2020)     loperamide (IMODIUM A-D) 2 MG tablet Take 2-4 mg by mouth 4 (four) times daily as needed for diarrhea or loose stools. (Patient not taking: Reported on 11/09/2020)     LORazepam (ATIVAN) 2 MG tablet Take 2 mg by mouth as needed (give 30 minutes prior to dental/medical procedures.). 30 minutes prior to procedure  (Patient  not taking: Reported on 11/09/2020)     magnesium hydroxide (MILK OF MAGNESIA) 400 MG/5ML suspension Take 30 mLs by mouth daily as needed for mild constipation. (Patient not taking: Reported on 11/09/2020)     Neomycin-Bacitracin-Polymyxin (TRIPLE ANTIBIOTIC) [phone] OINT Apply 1 application topically as needed (wounds). (Patient not taking: Reported on 11/09/2020)     promethazine (PHENERGAN) 25 MG suppository Place 25 mg rectally every 4 (four) hours as needed (vomiting 2 or more times in 4 hours). (Patient not taking: Reported on 11/09/2020)     Sunscreens (CARMEX CLASSIC LIP BALM) STCK Apply 1 application topically as needed (prevent sunburn/irritation).     No current facility-administered medications for this visit.    Allergies Codeine   Physical Exam:  Wt 127 lb (57.6 kg)   LMP 10/22/2020   BMI 28.47 kg/m  Body mass index is 28.47 kg/m.  General appearance: Well nourished, well developed female in no acute distress.  Cardiovascular: normal s1 and s2.  No murmurs, rubs or gallops. Respiratory: Normal respiratory effort  Laboratory: as per hpi  Radiology: none  Assessment: pt stable  Plan:  1. Encounter for Depo-Provera contraception 1st dose received today. See below - medroxyPROGESTERone (DEPO-PROVERA) injection 150 mg  2. Menorrhagia with regular cycle I d/w her caregiver and the nurse at the facility and that I recommend doing depo provera again and hopefully she will become amenorrheic on it again.  and to continue this indefinitely for now and that the average age of menopause is 47. I d/w them that can have some AUB with depo provera and if not bothersome and heavy to do exp management.   RTC 57m for rpt depo provera injection  Cornelia Copa MD Attending Center for [facility])

## 2021-01-30 ENCOUNTER — Ambulatory Visit: Payer: Medicaid Other

## 2021-07-12 ENCOUNTER — Ambulatory Visit (INDEPENDENT_AMBULATORY_CARE_PROVIDER_SITE_OTHER): Payer: Medicare Other | Admitting: Medical

## 2021-07-12 ENCOUNTER — Encounter: Payer: Self-pay | Admitting: Medical

## 2021-07-12 VITALS — Wt 139.0 lb

## 2021-07-12 DIAGNOSIS — Z1231 Encounter for screening mammogram for malignant neoplasm of breast: Secondary | ICD-10-CM | POA: Diagnosis not present

## 2021-07-12 NOTE — Progress Notes (Signed)
?  History:  ?Ms. Hannah Houston is a 51 y.o. G0P0000 who presents to clinic today for annual exam. Patient has autism and profound mental and physical disabilities. She is non-verbal. Information given by care-taker today. She had last pap smear 04/01/2019 which was normal with negative HPV, however absent transformation zone. Patient was started on Depo Provera in August which has been continued in her residence q 3 months. She does not have any bleeding now.  ? ?The following portions of the patient's history were reviewed and updated as appropriate: allergies, current medications, family history, past medical history, social history, past surgical history and problem list. ? ?Review of Systems:  ?Review of Systems  ?Unable to perform ROS: Mental acuity  ? ?  ?Objective:  ?Physical Exam ?Wt 139 lb (63 kg)   BMI 31.16 kg/m?  ?Physical Exam ?Constitutional:   ?   General: She is not in acute distress. ?   Appearance: Normal appearance.  ?Pulmonary:  ?   Effort: Pulmonary effort is normal.  ?Skin: ?   General: Skin is warm and dry.  ?   Findings: No erythema.  ?Neurological:  ?   Mental Status: She is alert.  ? ? ?Health Maintenance Due  ?Topic Date Due  ? COVID-19 Vaccine (1) Never done  ? Hepatitis C Screening  Never done  ? TETANUS/TDAP  Never done  ? COLONOSCOPY (Pts 45-34yrs Insurance coverage will need to be confirmed)  Never done  ? ? ? ?Assessment & Plan:  ?1. Breast cancer screening by mammogram ?- MM 3D SCREEN BREAST BILATERAL; Future ?- May need to be performed under anesthesia as well  ? ?2. Cervical cancer screening  ?- In-basket message sent to surgeon and surgical scheduler for pap smear under anesthesia ? ?Approximately 15 minutes of total time was spent with this patient on chart review, history taking, coordination of care and documentation ? ?Marny Lowenstein, PA-C ?07/12/2021 ?2:26 PM ? ?

## 2021-07-24 ENCOUNTER — Telehealth: Payer: Self-pay | Admitting: General Practice

## 2021-07-24 NOTE — Telephone Encounter (Addendum)
-----   Message from Luvenia Redden, PA-C sent at 07/23/2021  4:32 PM EDT ----- ?Can someone please contact the patient's caregiver and let them know that we do not have to do the pap until 2026 and we would like to try to schedule that with her colonoscopy since she will already be sedated. They should reach out again closer to that time for her next appointment.  ? ?Thanks,  ?Almyra Free  ? ? ? ? ?

## 2021-07-24 NOTE — Telephone Encounter (Signed)
Called and left message on nurse's staff voicemail as I have been unable to reach a person directly. Left message stating Hannah Houston doesn't need a pap smear until 2026 and can have that done at time of colonoscopy to coordinate care. Recommended they call back closer to that time to schedule that appt.  ?

## 2021-07-31 ENCOUNTER — Encounter: Payer: Self-pay | Admitting: General Practice

## 2022-08-22 ENCOUNTER — Encounter (HOSPITAL_COMMUNITY): Payer: Self-pay

## 2022-08-22 ENCOUNTER — Emergency Department (HOSPITAL_COMMUNITY)
Admission: EM | Admit: 2022-08-22 | Discharge: 2022-08-22 | Disposition: A | Discharge status: Home / Self Care | Payer: Medicare Other | Attending: Emergency Medicine | Admitting: Emergency Medicine

## 2022-08-22 DIAGNOSIS — L0291 Cutaneous abscess, unspecified: Secondary | ICD-10-CM | POA: Insufficient documentation

## 2022-08-22 MED ORDER — ONDANSETRON HCL 4 MG/2ML IJ SOLN
4.0000 mg | Freq: Once | INTRAMUSCULAR | Status: AC
  Administered 2022-08-22: 4 mg via INTRAVENOUS
  Filled 2022-08-22: qty 2

## 2022-08-22 MED ORDER — LORAZEPAM 1 MG PO TABS
2.0000 mg | ORAL_TABLET | Freq: Once | ORAL | Status: AC
  Administered 2022-08-22: 2 mg via ORAL
  Filled 2022-08-22: qty 2

## 2022-08-22 MED ORDER — MORPHINE SULFATE (PF) 4 MG/ML IV SOLN
4.0000 mg | Freq: Once | INTRAVENOUS | Status: AC
  Administered 2022-08-22: 4 mg via INTRAVENOUS
  Filled 2022-08-22: qty 1

## 2022-08-22 MED ORDER — LIDOCAINE-EPINEPHRINE (PF) 2 %-1:200000 IJ SOLN
10.0000 mL | Freq: Once | INTRAMUSCULAR | Status: AC
  Administered 2022-08-22: 10 mL
  Filled 2022-08-22: qty 20

## 2022-08-22 NOTE — ED Provider Notes (Signed)
[place] EMERGENCY DEPARTMENT AT [facility] Provider Note   CSN: [id number] Arrival date & time: 08/22/22  1419     History  Chief Complaint  Patient presents with   Insect Bite    Hannah Houston is a 52 y.o. female.  52 year old female presents with her caretaker for evaluation of swelling and redness to the right inner thigh which has been present for several days.  According to the caretaker a picture of this was sent to the doctor who started patient on doxycycline.  Has not had any improvement since then.  It is actually worsening.  No fever.  The history is provided by the patient. No language interpreter was used.       Home Medications Prior to Admission medications   Medication Sig Start Date End Date Taking? Authorizing Provider  acetaminophen (TYLENOL) 325 MG tablet Take 650 mg by mouth every 4 (four) hours as needed for mild pain, moderate pain, fever or headache. And 3 times daily from 9/10-9/13 Patient not taking: Reported on 11/09/2020    [provider]  alum & mag hydroxide-simeth (MAALOX/MYLANTA) 200-200-20 MG/5ML suspension Take 15 mLs by mouth every 6 (six) hours as needed for indigestion or heartburn. Patient not taking: Reported on 11/09/2020    [provider]  bisacodyl (DULCOLAX) 10 MG suppository Place 10 mg rectally as needed for moderate constipation. Patient not taking: Reported on 11/09/2020    [provider]  carbamide peroxide (DEBROX) 6.5 % OTIC solution Place 5 drops into both ears as needed (ear wax removal). Patient not taking: Reported on 11/09/2020    [provider]  Cholecalciferol (VITAMIN D3) 50 MCG (2000 UT) TABS Take 2,000 Units by mouth daily. (0800) Patient not taking: Reported on 11/09/2020    [provider]  diphenhydrAMINE (BENADRYL) 25 MG tablet Take 25 mg by mouth every 4 (four) hours as needed (nasal congestion/itching/allergies.). Patient not taking: Reported on  11/09/2020    [provider]  divalproex (DEPAKOTE SPRINKLE) 125 MG capsule Take 250 mg by mouth 2 (two) times daily. (0800 & 2000)    [provider]  DULoxetine (CYMBALTA) 60 MG capsule Take 60 mg by mouth daily. (0800)    [provider]  guaiFENesin (ROBITUSSIN) 100 MG/5ML liquid Take 300 mg by mouth every 4 (four) hours as needed for cough. Patient not taking: Reported on 11/09/2020    [provider]  hydrocortisone cream 1 % Apply 1 application topically as needed for itching. Patient not taking: Reported on 11/09/2020    [provider]  ibuprofen (ADVIL) 200 MG tablet Take 200-600 mg by mouth every 8 (eight) hours as needed (discomfort/pain). Patient not taking: Reported on 11/09/2020    [provider]  loperamide (IMODIUM A-D) 2 MG tablet Take 2-4 mg by mouth 4 (four) times daily as needed for diarrhea or loose stools. Patient not taking: Reported on 11/09/2020    [provider]  LORazepam (ATIVAN) 1 MG tablet Take 1-2 tablets (1-2 mg total) by mouth 3 (three) times daily. Takes 1 mg in the morning at 8:00 am, 1 mg in the afternoon at 1400 and 1 mg in the evening 2000 And 1 tablet 30 minutes prior to procedure Patient not taking: Reported on 07/12/2021 09/04/18   Elgergawy, Leana Roe, MD  LORazepam (ATIVAN) 2 MG tablet Take 2 mg by mouth as needed (give 30 minutes prior to dental/medical procedures.). 30 minutes prior to procedure  Patient not taking: Reported on 11/09/2020  [provider]  magnesium hydroxide (MILK OF MAGNESIA) 400 MG/5ML suspension Take 30 mLs by mouth daily as needed for mild constipation. Patient not taking: Reported on 11/09/2020    [provider]  medroxyPROGESTERone (DEPO-PROVERA) 150 MG/ML injection Inject 1 mL (150 mg total) into the muscle every 3 (three) months. 11/09/20   Cocke Bing, MD  Melatonin 3 MG CAPS Take 3 mg by mouth at bedtime. (2000)    [provider]   Neomycin-Bacitracin-Polymyxin (TRIPLE ANTIBIOTIC) [phone] OINT Apply 1 application topically as needed (wounds). Patient not taking: Reported on 11/09/2020    [provider]  promethazine (PHENERGAN) 25 MG suppository Place 25 mg rectally every 4 (four) hours as needed (vomiting 2 or more times in 4 hours). Patient not taking: Reported on 11/09/2020    [provider]  promethazine (PHENERGAN) 25 MG tablet Take 25 mg by mouth every 4 (four) hours as needed (vomiting 2 or more times in 4 hours).    [provider]  QUEtiapine (SEROQUEL) 25 MG tablet Take 25 mg by mouth daily. (0800)    [provider]  QUEtiapine (SEROQUEL) 50 MG tablet Take 50 mg by mouth at bedtime. (2000)    [provider]  senna (SENOKOT) 8.6 MG TABS tablet Take 1 tablet by mouth at bedtime. (2000)    [provider]  Sunscreens (CARMEX CLASSIC LIP BALM) STCK Apply 1 application topically as needed (prevent sunburn/irritation).    [provider]  triazolam (HALCION) 0.125 MG tablet Take 0.375 mg by mouth as needed (30 minutes prior to dental & medical procedures.).    [provider]  vitamin B-12 (CYANOCOBALAMIN) 500 MCG tablet Take 500 mcg by mouth daily. (0800)    [provider]  Vitamins A & D (VITAMIN A & D) ointment Apply 1 application topically daily. (0800) To lower legs and arms daily    [provider]      Allergies    Codeine    Review of Systems   Review of Systems  Constitutional:  Negative for chills and fever.  Skin:  Positive for wound.  All other systems reviewed and are negative.   Physical Exam Updated Vital Signs BP 99/78 (BP Location: Left Arm)   Pulse 89   Temp 97.9 F (36.6 C) (Axillary)   Resp 18   SpO2 100%  Physical Exam Vitals and nursing note reviewed.  Constitutional:      General: She is not in acute distress.    Appearance: Normal appearance. She is not ill-appearing.  HENT:      Head: Normocephalic and atraumatic.     Nose: Nose normal.  Eyes:     Conjunctiva/sclera: Conjunctivae normal.  Cardiovascular:     Rate and Rhythm: Normal rate.  Pulmonary:     Effort: Pulmonary effort is normal. No respiratory distress.  Musculoskeletal:        General: No deformity.  Skin:    Findings: No rash.     Comments: Right Anastassia with significant erythema, as well as fluctuant area of swelling.  No visible drainage.  Neurological:     Mental Status: She is alert.     ED Results / Procedures / Treatments   Labs (all labs ordered are listed, but only abnormal results are displayed) Labs Reviewed - No data to display  EKG None  Radiology No results found.  Procedures .Marland KitchenIncision and Drainage  Date/Time: 08/22/2022 6:13 PM  Performed by: Marita Kansas, PA-C Authorized by: Marita Kansas, PA-C  Consent:    Consent obtained:  Verbal   Consent given by:  Patient   Risks discussed:  Bleeding, incomplete drainage, pain and damage to other organs   Alternatives discussed:  No treatment Universal protocol:    Procedure explained and questions answered to patient or proxy's satisfaction: yes     Relevant documents present and verified: yes     Test results available : yes     Imaging studies available: yes     Required blood products, implants, devices, and special equipment available: yes     Site/side marked: yes     Immediately prior to procedure, a time out was called: yes     Patient identity confirmed:  Verbally with patient Location:    Type:  Abscess   Size:  3 cm   Location:  Lower extremity   Lower extremity location:  Leg   Leg location:  R upper leg Pre-procedure details:    Skin preparation:  Betadine Anesthesia:    Anesthesia method:  Local infiltration   Local anesthetic:  Lidocaine 2% WITH epi Procedure type:    Complexity:  Simple Procedure details:    Incision types:  Single straight   Incision depth:  Subcutaneous   Wound management:   Probed and deloculated, irrigated with saline and extensive cleaning   Drainage:  Bloody   Drainage amount:  Moderate   Wound treatment:  Wound left open   Packing materials:  None Post-procedure details:    Procedure completion:  Tolerated well, no immediate complications Comments:     Packing not placed due to patient's ability to tolerate the procedure.     Medications Ordered in ED Medications  lidocaine-EPINEPHrine (XYLOCAINE W/EPI) 2 %-1:200000 (PF) injection 10 mL (has no administration in time range)  LORazepam (ATIVAN) tablet 2 mg (2 mg Oral Given 08/22/22 1509)    ED Course/ Medical Decision Making/ A&P                             Medical Decision Making Risk Prescription drug management.   52 year old female with autism, nonverbal at baseline presents today for concern of wound to right upper leg.  Started on doxycycline 2 days ago.  No fever, or drainage from this area.  Erythema is getting worse.  On exam she does have a fluctuant mass.  Will drain this.  Initially given Ativan followed by 4 mg of morphine to help facilitate procedure.  Procedure was performed.  Patient tolerated this well.  Unable to place packing.  Bloody drainage from the wound.  Discussed continuing doxycycline.  Worsening signs of infection discussed that would warrant return to the emergency department. Caretaker, as well as brother who is her legal guardian are in agreement with plan.  Patient discharged in stable condition.   Final Clinical Impression(s) / ED Diagnoses Final diagnoses:  Abscess    Rx / DC Orders ED Discharge Orders     None         Marita Kansas, PA-C 08/22/22 1816    Linwood Dibbles, MD 08/23/22 [phone]

## 2022-08-22 NOTE — ED Triage Notes (Signed)
Pt presents with c/o insect bite to her inner right thigh that appeared on Monday. Pt resides at a group home, caregiver with pt reports that she has been put on antibiotics already and they do not seem to be helping. Area is very red and swollen with exudate coming from area.

## 2022-08-22 NOTE — Discharge Instructions (Signed)
Your abscess was drained.  We had some bloody drainage from this area.  Keep the site clean.  This should heal within the next 2 to 3 days.  Continue taking the doxycycline.  If the redness, swelling is worse or you develop a fever please return for evaluation otherwise follow-up with your primary care doctor.

## 2022-09-09 ENCOUNTER — Other Ambulatory Visit: Payer: Self-pay | Admitting: Family Medicine

## 2022-09-09 DIAGNOSIS — Z1231 Encounter for screening mammogram for malignant neoplasm of breast: Secondary | ICD-10-CM

## 2022-09-12 ENCOUNTER — Ambulatory Visit: Payer: Medicare Other

## 2022-09-17 ENCOUNTER — Ambulatory Visit: Payer: Medicare Other

## 2022-09-25 ENCOUNTER — Ambulatory Visit: Payer: Medicare Other

## 2022-09-25 DIAGNOSIS — Z1231 Encounter for screening mammogram for malignant neoplasm of breast: Secondary | ICD-10-CM

## 2022-09-26 ENCOUNTER — Other Ambulatory Visit: Payer: Self-pay | Admitting: Family Medicine

## 2022-09-26 DIAGNOSIS — Z1231 Encounter for screening mammogram for malignant neoplasm of breast: Secondary | ICD-10-CM

## 2022-10-22 ENCOUNTER — Other Ambulatory Visit: Payer: Medicare Other

## 2022-10-29 ENCOUNTER — Other Ambulatory Visit: Payer: Medicare Other

## 2022-11-08 ENCOUNTER — Other Ambulatory Visit: Payer: Medicare Other

## 2023-04-16 ENCOUNTER — Ambulatory Visit
Admission: RE | Admit: 2023-04-16 | Discharge: 2023-04-16 | Disposition: A | Discharge status: Home / Self Care | Payer: Medicare Other | Source: Ambulatory Visit | Attending: Family Medicine | Admitting: Family Medicine

## 2023-04-16 ENCOUNTER — Other Ambulatory Visit: Payer: Self-pay | Admitting: Family Medicine

## 2023-04-16 ENCOUNTER — Other Ambulatory Visit: Payer: Medicare Other

## 2023-04-16 DIAGNOSIS — Z1231 Encounter for screening mammogram for malignant neoplasm of breast: Secondary | ICD-10-CM

## 2023-04-16 DIAGNOSIS — N6310 Unspecified lump in the right breast, unspecified quadrant: Secondary | ICD-10-CM

## 2023-04-23 ENCOUNTER — Other Ambulatory Visit: Payer: Medicare Other

## 2023-05-09 ENCOUNTER — Encounter: Payer: Self-pay | Admitting: Family Medicine

## 2023-05-14 ENCOUNTER — Ambulatory Visit
Admission: RE | Admit: 2023-05-14 | Discharge: 2023-05-14 | Disposition: A | Discharge status: Home / Self Care | Payer: Medicare Other | Source: Ambulatory Visit | Attending: Family Medicine | Admitting: Family Medicine

## 2023-05-14 ENCOUNTER — Other Ambulatory Visit: Payer: Self-pay | Admitting: Family Medicine

## 2023-05-14 DIAGNOSIS — N6310 Unspecified lump in the right breast, unspecified quadrant: Secondary | ICD-10-CM

## 2023-05-14 DIAGNOSIS — Z1231 Encounter for screening mammogram for malignant neoplasm of breast: Secondary | ICD-10-CM

## 2023-05-23 ENCOUNTER — Emergency Department (HOSPITAL_COMMUNITY)

## 2023-05-23 ENCOUNTER — Emergency Department (HOSPITAL_COMMUNITY)
Admission: EM | Admit: 2023-05-23 | Discharge: 2023-05-23 | Disposition: A | Discharge status: Home / Self Care | Attending: Emergency Medicine | Admitting: Emergency Medicine

## 2023-05-23 ENCOUNTER — Other Ambulatory Visit: Payer: Self-pay

## 2023-05-23 ENCOUNTER — Encounter (HOSPITAL_COMMUNITY): Payer: Self-pay

## 2023-05-23 DIAGNOSIS — S0011XA Contusion of right eyelid and periocular area, initial encounter: Secondary | ICD-10-CM | POA: Insufficient documentation

## 2023-05-23 DIAGNOSIS — S0012XA Contusion of left eyelid and periocular area, initial encounter: Secondary | ICD-10-CM | POA: Diagnosis not present

## 2023-05-23 DIAGNOSIS — S0083XA Contusion of other part of head, initial encounter: Secondary | ICD-10-CM | POA: Diagnosis not present

## 2023-05-23 DIAGNOSIS — S0033XA Contusion of nose, initial encounter: Secondary | ICD-10-CM | POA: Diagnosis not present

## 2023-05-23 DIAGNOSIS — W228XXA Striking against or struck by other objects, initial encounter: Secondary | ICD-10-CM | POA: Insufficient documentation

## 2023-05-23 DIAGNOSIS — S0990XA Unspecified injury of head, initial encounter: Secondary | ICD-10-CM | POA: Diagnosis present

## 2023-05-23 MED ORDER — HALOPERIDOL 5 MG PO TABS
5.0000 mg | ORAL_TABLET | Freq: Once | ORAL | Status: AC
  Administered 2023-05-23: 5 mg via ORAL
  Filled 2023-05-23: qty 1

## 2023-05-23 MED ORDER — LORAZEPAM 1 MG PO TABS
2.0000 mg | ORAL_TABLET | Freq: Once | ORAL | Status: AC
  Administered 2023-05-23: 2 mg via ORAL
  Filled 2023-05-23: qty 2

## 2023-05-23 NOTE — ED Notes (Signed)
 Patient transported to CT

## 2023-05-23 NOTE — ED Notes (Signed)
 Attempted to contact legal guardian with number in chart, number does not work, pt has caregiver at bedside from facility, caregiver notified of care and will update legal guardian.

## 2023-05-23 NOTE — ED Provider Notes (Signed)
 [place] EMERGENCY DEPARTMENT AT [facility] Provider Note   CSN: [id number] Arrival date & time: 05/23/23  1431     History  Chief Complaint  Patient presents with   Eye Injury    Hannah Houston is a 53 y.o. female.   Eye Injury  Patient is nonverbal at baseline.  Comes from her facility.  This caregiver saw her today and last saw her on Wednesday.  On Wednesday had some bruising to her face.  Has ecchymosis bilateral periorbital area with some greenish ecchymosis over the bridge of her nose. Appears at her neurologic baseline.    Past Medical History:  Diagnosis Date   Anemia    Gastritis    GERD (gastroesophageal reflux disease)    Intellectual disability    Iron deficiency    Mental retardation    Non-verbal learning disorder    Poor posture    walks bent over   Urine incontinence    occassional, uses depends    Home Medications Prior to Admission medications   Medication Sig Start Date End Date Taking? Authorizing Provider  acetaminophen (TYLENOL) 325 MG tablet Take 650 mg by mouth every 4 (four) hours as needed for mild pain, moderate pain, fever or headache. And 3 times daily from 9/10-9/13 Patient not taking: Reported on 11/09/2020    [provider]  alum & mag hydroxide-simeth (MAALOX/MYLANTA) 200-200-20 MG/5ML suspension Take 15 mLs by mouth every 6 (six) hours as needed for indigestion or heartburn. Patient not taking: Reported on 11/09/2020    [provider]  bisacodyl (DULCOLAX) 10 MG suppository Place 10 mg rectally as needed for moderate constipation. Patient not taking: Reported on 11/09/2020    [provider]  carbamide peroxide (DEBROX) 6.5 % OTIC solution Place 5 drops into both ears as needed (ear wax removal). Patient not taking: Reported on 11/09/2020    [provider]  Cholecalciferol (VITAMIN D3) 50 MCG (2000 UT) TABS Take 2,000 Units by mouth daily. (0800) Patient not taking: Reported on  11/09/2020    [provider]  diphenhydrAMINE (BENADRYL) 25 MG tablet Take 25 mg by mouth every 4 (four) hours as needed (nasal congestion/itching/allergies.). Patient not taking: Reported on 11/09/2020    [provider]  divalproex (DEPAKOTE SPRINKLE) 125 MG capsule Take 250 mg by mouth 2 (two) times daily. (0800 & 2000)    [provider]  DULoxetine (CYMBALTA) 60 MG capsule Take 60 mg by mouth daily. (0800)    [provider]  guaiFENesin (ROBITUSSIN) 100 MG/5ML liquid Take 300 mg by mouth every 4 (four) hours as needed for cough. Patient not taking: Reported on 11/09/2020    [provider]  hydrocortisone cream 1 % Apply 1 application topically as needed for itching. Patient not taking: Reported on 11/09/2020    [provider]  ibuprofen (ADVIL) 200 MG tablet Take 200-600 mg by mouth every 8 (eight) hours as needed (discomfort/pain). Patient not taking: Reported on 11/09/2020    [provider]  loperamide (IMODIUM A-D) 2 MG tablet Take 2-4 mg by mouth 4 (four) times daily as needed for diarrhea or loose stools. Patient not taking: Reported on 11/09/2020    [provider]  LORazepam (ATIVAN) 1 MG tablet Take 1-2 tablets (1-2 mg total) by mouth 3 (three) times daily. Takes 1 mg in the morning at 8:00 am, 1 mg in the afternoon at 1400 and 1 mg in the evening 2000 And 1 tablet 30 minutes prior to  procedure Patient not taking: Reported on 07/12/2021 09/04/18   Elgergawy, Leana Roe, MD  LORazepam (ATIVAN) 2 MG tablet Take 2 mg by mouth as needed (give 30 minutes prior to dental/medical procedures.). 30 minutes prior to procedure  Patient not taking: Reported on 11/09/2020    [provider]  magnesium hydroxide (MILK OF MAGNESIA) 400 MG/5ML suspension Take 30 mLs by mouth daily as needed for mild constipation. Patient not taking: Reported on 11/09/2020    [provider]  medroxyPROGESTERone (DEPO-PROVERA) 150  MG/ML injection Inject 1 mL (150 mg total) into the muscle every 3 (three) months. 11/09/20   [place] Bing, MD  Melatonin 3 MG CAPS Take 3 mg by mouth at bedtime. (2000)    [provider]  Neomycin-Bacitracin-Polymyxin (TRIPLE ANTIBIOTIC) [phone] OINT Apply 1 application topically as needed (wounds). Patient not taking: Reported on 11/09/2020    [provider]  promethazine (PHENERGAN) 25 MG suppository Place 25 mg rectally every 4 (four) hours as needed (vomiting 2 or more times in 4 hours). Patient not taking: Reported on 11/09/2020    [provider]  promethazine (PHENERGAN) 25 MG tablet Take 25 mg by mouth every 4 (four) hours as needed (vomiting 2 or more times in 4 hours).    [provider]  QUEtiapine (SEROQUEL) 25 MG tablet Take 25 mg by mouth daily. (0800)    [provider]  QUEtiapine (SEROQUEL) 50 MG tablet Take 50 mg by mouth at bedtime. (2000)    [provider]  senna (SENOKOT) 8.6 MG TABS tablet Take 1 tablet by mouth at bedtime. (2000)    [provider]  Sunscreens (CARMEX CLASSIC LIP BALM) STCK Apply 1 application topically as needed (prevent sunburn/irritation).    [provider]  triazolam (HALCION) 0.125 MG tablet Take 0.375 mg by mouth as needed (30 minutes prior to dental & medical procedures.).    [provider]  vitamin B-12 (CYANOCOBALAMIN) 500 MCG tablet Take 500 mcg by mouth daily. (0800)    [provider]  Vitamins A & D (VITAMIN A & D) ointment Apply 1 application topically daily. (0800) To lower legs and arms daily    [provider]      Allergies    Codeine    Review of Systems   Review of Systems  Physical Exam Updated Vital Signs BP (!) 151/111 (BP Location: Right Arm)   Pulse 82   Temp 97.8 F (36.6 C) (Oral)   Resp 16   Ht 4\' 8"  (1.422 m)   Wt 63 kg   SpO2 97%   BMI 31.14 kg/m  Physical Exam Vitals and nursing note reviewed.  HENT:      Head:     Comments: Bilateral periorbital ecchymosis/potential raccoon sign.  Does have some bruising to bridge of nose also.  Eye movements grossly appear intact. Chest:     Chest wall: No tenderness.  Abdominal:     Tenderness: There is no abdominal tenderness.  Musculoskeletal:     Cervical back: No tenderness.  Neurological:     Mental Status: She is alert. Mental status is at baseline.     ED Results / Procedures / Treatments   Labs (all labs ordered are listed, but only abnormal results are displayed) Labs Reviewed - No data to display  EKG None  Radiology CT HEAD WO CONTRAST ( ) Result Date: 05/23/2023 CLINICAL DATA:  Head trauma, abnormal mental status (Age 67-64y); Facial trauma, blunt EXAM: CT HEAD WITHOUT CONTRAST CT  MAXILLOFACIAL WITHOUT CONTRAST TECHNIQUE: Multidetector CT imaging of the head and maxillofacial structures were performed using the standard protocol without intravenous contrast. Multiplanar CT image reconstructions of the maxillofacial structures were also generated. RADIATION DOSE REDUCTION: This exam was performed according to the departmental dose-optimization program which includes automated exposure control, adjustment of the mA and/or kV according to patient size and/or use of iterative reconstruction technique. COMPARISON:  None Available. FINDINGS: CT HEAD FINDINGS Brain: No evidence of acute infarction, hemorrhage, hydrocephalus, extra-axial collection or mass lesion/mass effect. Vascular: No hyperdense vessel. Skull: No acute fracture. Other: No mastoid effusions. CT MAXILLOFACIAL FINDINGS Osseous: No acute fracture.  TMJs are located. Orbits: Negative. No traumatic or inflammatory finding. Sinuses: Left maxillary sinus retention cyst. Otherwise, clear sinuses. Soft tissues: Left forehead contusion. IMPRESSION: No acute intracranial or maxillofacial fracture. Left forehead contusion. Electronically Signed   By: Feliberto Harts M.D.   On: 05/23/2023  21:27   CT Maxillofacial Wo Contrast Result Date: 05/23/2023 CLINICAL DATA:  Head trauma, abnormal mental status (Age 58-64y); Facial trauma, blunt EXAM: CT HEAD WITHOUT CONTRAST CT MAXILLOFACIAL WITHOUT CONTRAST TECHNIQUE: Multidetector CT imaging of the head and maxillofacial structures were performed using the standard protocol without intravenous contrast. Multiplanar CT image reconstructions of the maxillofacial structures were also generated. RADIATION DOSE REDUCTION: This exam was performed according to the departmental dose-optimization program which includes automated exposure control, adjustment of the mA and/or kV according to patient size and/or use of iterative reconstruction technique. COMPARISON:  None Available. FINDINGS: CT HEAD FINDINGS Brain: No evidence of acute infarction, hemorrhage, hydrocephalus, extra-axial collection or mass lesion/mass effect. Vascular: No hyperdense vessel. Skull: No acute fracture. Other: No mastoid effusions. CT MAXILLOFACIAL FINDINGS Osseous: No acute fracture.  TMJs are located. Orbits: Negative. No traumatic or inflammatory finding. Sinuses: Left maxillary sinus retention cyst. Otherwise, clear sinuses. Soft tissues: Left forehead contusion. IMPRESSION: No acute intracranial or maxillofacial fracture. Left forehead contusion. Electronically Signed   By: Feliberto Harts M.D.   On: 05/23/2023 21:27    Procedures Procedures    Medications Ordered in ED Medications  LORazepam (ATIVAN) tablet 2 mg (2 mg Oral Given 05/23/23 1554)  haloperidol (HALDOL) tablet 5 mg (5 mg Oral Given 05/23/23 1554)    ED Course/ Medical Decision Making/ A&P                                 Medical Decision Making Amount and/or Complexity of Data Reviewed Radiology: ordered.  Risk Prescription drug management.   Patient with periorbital ecchymosis.  Patient does at times hit the back of her head.  Does have anterior bruising however.  Differential diagnosis includes  cause such as basilar skull fracture.  Will get CT scan of the head and maxillofacial.  Does have a normal afternoon Ativan dose [place] but also has had Haldol orally prior to procedures in the past and we will give that to help facilitate CT scan.  CT scans done and reassuring.  Will discharge home with caregiver.        Final Clinical Impression(s) / ED Diagnoses Final diagnoses:  Contusion of face, initial encounter    Rx / DC Orders ED Discharge Orders     None         Benjiman Core, MD 05/23/23 2351

## 2023-05-23 NOTE — ED Triage Notes (Signed)
 Pt arrived with caregiver from facility. Ble eye redness. Unknown what trauma occurred to eyes. Caregiver states happed during 3rd shift, patient is not usually left alone. Pt Non verbal. No other concerns reported

## 2023-05-23 NOTE — Discharge Instructions (Signed)
 The CT scan was reassuring.  No severe injury found.  Follow-up with your doctor as needed.

## 2023-09-17 ENCOUNTER — Ambulatory Visit
Admission: RE | Admit: 2023-09-17 | Discharge: 2023-09-17 | Disposition: A | Discharge status: Home / Self Care | Source: Ambulatory Visit | Attending: Family Medicine | Admitting: Family Medicine

## 2023-09-17 ENCOUNTER — Other Ambulatory Visit: Payer: Self-pay | Admitting: Family Medicine

## 2023-09-17 DIAGNOSIS — M25552 Pain in left hip: Secondary | ICD-10-CM

## 2024-03-25 ENCOUNTER — Other Ambulatory Visit: Payer: Self-pay | Admitting: Family Medicine

## 2024-03-25 DIAGNOSIS — Z1231 Encounter for screening mammogram for malignant neoplasm of breast: Secondary | ICD-10-CM

## 2024-04-06 ENCOUNTER — Other Ambulatory Visit: Payer: Self-pay | Admitting: Family Medicine

## 2024-04-06 ENCOUNTER — Ambulatory Visit
Admission: RE | Admit: 2024-04-06 | Discharge: 2024-04-06 | Disposition: A | Source: Ambulatory Visit | Attending: Family Medicine | Admitting: Family Medicine

## 2024-04-06 DIAGNOSIS — Z1231 Encounter for screening mammogram for malignant neoplasm of breast: Secondary | ICD-10-CM
